# Patient Record
Sex: Male | Born: 1965
Health system: Southern US, Community
[De-identification: ages and names within clinical notes are randomized; demographics above are authoritative.]

## PROBLEM LIST (undated history)

## (undated) DIAGNOSIS — I10 Essential (primary) hypertension: Secondary | ICD-10-CM

## (undated) DIAGNOSIS — J45909 Unspecified asthma, uncomplicated: Secondary | ICD-10-CM

## (undated) DIAGNOSIS — M1990 Unspecified osteoarthritis, unspecified site: Secondary | ICD-10-CM

## (undated) HISTORY — PX: WRIST SURGERY: SHX841

---

## 1997-10-13 ENCOUNTER — Ambulatory Visit (HOSPITAL_COMMUNITY): Admission: RE | Admit: 1997-10-13 | Discharge: 1997-10-13 | Payer: Self-pay | Admitting: Orthopedic Surgery

## 2002-10-03 ENCOUNTER — Encounter: Payer: Self-pay | Admitting: Physical Medicine and Rehabilitation

## 2002-10-03 ENCOUNTER — Ambulatory Visit (HOSPITAL_COMMUNITY)
Admission: RE | Admit: 2002-10-03 | Discharge: 2002-10-03 | Payer: Self-pay | Admitting: Physical Medicine and Rehabilitation

## 2013-12-20 ENCOUNTER — Ambulatory Visit (INDEPENDENT_AMBULATORY_CARE_PROVIDER_SITE_OTHER): Payer: BC Managed Care – PPO | Admitting: Podiatry

## 2013-12-20 ENCOUNTER — Encounter: Payer: Self-pay | Admitting: Podiatry

## 2013-12-20 VITALS — BP 123/83 | HR 71 | Ht 68.0 in | Wt 231.0 lb

## 2013-12-20 DIAGNOSIS — M7741 Metatarsalgia, right foot: Secondary | ICD-10-CM | POA: Insufficient documentation

## 2013-12-20 DIAGNOSIS — M2011 Hallux valgus (acquired), right foot: Secondary | ICD-10-CM

## 2013-12-20 DIAGNOSIS — M21962 Unspecified acquired deformity of left lower leg: Secondary | ICD-10-CM

## 2013-12-20 DIAGNOSIS — M79673 Pain in unspecified foot: Secondary | ICD-10-CM

## 2013-12-20 DIAGNOSIS — M2010 Hallux valgus (acquired), unspecified foot: Secondary | ICD-10-CM | POA: Insufficient documentation

## 2013-12-20 DIAGNOSIS — M722 Plantar fascial fibromatosis: Secondary | ICD-10-CM

## 2013-12-20 NOTE — Patient Instructions (Signed)
Seen for bilateral foot pain. Cortisone injection given to right heel. Metatarsal binder dispensed. Metatarsal pad placed in both feet.  Need custom orthotics. Will prepare next week.

## 2013-12-20 NOTE — Progress Notes (Signed)
Subjective: Patient presents complaining of bilateral foot pain x 10-15 years.  He has had bone spur on right heel and was treated with cortisone and orthotics.  Now the ball of left foot is very painful especially in the morning and at the end of the day.  He is on feet 8 hours a day.   Objective: Dermatologic: Normal skin without any lesions.  Vascular: All pedal pulses are palpable.  Orthopedic: Hallux valgus with bunion right. Hypermobile first ray right. Short first ray left. Pain in right heel and left lesser metatarsophalangeal joint 2-4.  Radiographic examination was done. Findings reveal bone callus over 2nd and 3rd Metatarsal shaft right. Enlarged first metatarsal head on right.  On left foot show, very short first ray in AP view and presence of inferior calcaneal spur right foot in lateral view. And increased lateral deviation of Calcaneocuboid angle bilateral in AP view.  Assessment: 1. Lesser metatarsalgia left foot. 2. Short first ray left foot. 3, HAV with bunion right. 4. Hypermobile first ray right. 5. Plantar fasciitis right.   Plan: Reviewed findings and available options. Right heel injected with mixture of 4 mg Dexamethasone, 4 mg Triamcinolone, and 1 cc of 0.5% Marcaine plain. Patient tolerated well without difficulty.  Metatarsal binder dispensed for both feet. Metatarsal pad placed on both shoe liner. Return for custom orthotics.

## 2013-12-27 ENCOUNTER — Ambulatory Visit: Payer: BC Managed Care – PPO | Admitting: Podiatry

## 2014-01-03 ENCOUNTER — Encounter: Payer: Self-pay | Admitting: Podiatry

## 2014-01-03 ENCOUNTER — Ambulatory Visit (INDEPENDENT_AMBULATORY_CARE_PROVIDER_SITE_OTHER): Payer: BC Managed Care – PPO | Admitting: Podiatry

## 2014-01-03 VITALS — BP 129/83 | HR 71

## 2014-01-03 DIAGNOSIS — M7741 Metatarsalgia, right foot: Secondary | ICD-10-CM

## 2014-01-03 DIAGNOSIS — M722 Plantar fascial fibromatosis: Secondary | ICD-10-CM

## 2014-01-03 DIAGNOSIS — M2010 Hallux valgus (acquired), unspecified foot: Secondary | ICD-10-CM

## 2014-01-03 NOTE — Patient Instructions (Signed)
Both feet casted for orthotics. 

## 2014-01-03 NOTE — Progress Notes (Signed)
Subjective: Right foot has done well after injection on heel. Both feet improved some with Metatarsal binder and pad. Still doing soaking.   Objective: No change in findings.  Dermatologic: Normal skin without any lesions.  Vascular: All pedal pulses are palpable.  Orthopedic: Hallux valgus with bunion right. Hypermobile first ray right. Short first ray left. Pain in right heel and left lesser metatarsophalangeal joint 2-4.   Assessment: 1. Lesser metatarsalgia left foot. 2. Short first ray left foot. 3, HAV with bunion right. 4. Hypermobile first ray right. 5. Plantar fasciitis right.   Plan: Reviewed findings and available options. Both feet casted for orthotics.

## 2014-03-07 ENCOUNTER — Ambulatory Visit (INDEPENDENT_AMBULATORY_CARE_PROVIDER_SITE_OTHER): Payer: BLUE CROSS/BLUE SHIELD | Admitting: Podiatry

## 2014-03-07 ENCOUNTER — Encounter: Payer: Self-pay | Admitting: Podiatry

## 2014-03-07 DIAGNOSIS — M7741 Metatarsalgia, right foot: Secondary | ICD-10-CM

## 2014-03-07 NOTE — Patient Instructions (Signed)
Doing well with orthotics. Use metatarsal binder as needed. Return when pain comes back.

## 2014-03-07 NOTE — Progress Notes (Signed)
Doing much better since wearing orthotics. Uses binder as needed.  Still has some tenderness on left bunion site. Discussed good shoe selection.  Patient will return if pain comes back.

## 2014-08-31 ENCOUNTER — Ambulatory Visit: Payer: Self-pay | Admitting: Surgery

## 2014-08-31 ENCOUNTER — Other Ambulatory Visit: Payer: Self-pay | Admitting: Surgery

## 2014-08-31 DIAGNOSIS — R222 Localized swelling, mass and lump, trunk: Secondary | ICD-10-CM

## 2014-09-04 ENCOUNTER — Other Ambulatory Visit: Payer: Self-pay | Admitting: Surgery

## 2014-09-04 DIAGNOSIS — Z139 Encounter for screening, unspecified: Secondary | ICD-10-CM

## 2014-09-12 ENCOUNTER — Ambulatory Visit
Admission: RE | Admit: 2014-09-12 | Discharge: 2014-09-12 | Disposition: A | Discharge status: Home / Self Care | Payer: BLUE CROSS/BLUE SHIELD | Source: Ambulatory Visit | Attending: Surgery | Admitting: Surgery

## 2014-09-12 ENCOUNTER — Ambulatory Visit
Admission: RE | Admit: 2014-09-12 | Discharge: 2014-09-12 | Disposition: A | Discharge status: Home / Self Care | Payer: Self-pay | Source: Ambulatory Visit | Attending: Surgery | Admitting: Surgery

## 2014-09-12 DIAGNOSIS — R222 Localized swelling, mass and lump, trunk: Secondary | ICD-10-CM

## 2014-09-12 DIAGNOSIS — Z139 Encounter for screening, unspecified: Secondary | ICD-10-CM

## 2014-09-12 MED ORDER — GADOBENATE DIMEGLUMINE 529 MG/ML IV SOLN
20.0000 mL | Freq: Once | INTRAVENOUS | Status: AC | PRN
  Administered 2014-09-12: 20 mL via INTRAVENOUS

## 2014-10-02 ENCOUNTER — Ambulatory Visit: Payer: Self-pay | Admitting: Surgery

## 2014-10-02 NOTE — H&P (Signed)
Gregory Porter 08/31/2014 1:43 PM Location: [facility] Patient #: [id number] DOB: [birth year] Married / Language: Gregory Porter / Race: White Male  History of Present Illness [name] A. [name] MD; 08/31/2014 4:16 PM) Patient words: lipoma lower back   Pt sent at the request of Dr Nelva Bush for two soft tissue masses lower back that are tender. Pt complains of low back pain. He fell 10 years ago. He has noticed two painful knots on lower back for a number of months. Theses are tender especially when he lays on his back. No redness or drainage. Has problems with his feet. No leg numbness or weakness.  The patient is a 49 year old male    Other Problems [name], [place]; 08/31/2014 1:43 PM) Asthma High blood pressure  Past Surgical History [name], [place]; 08/31/2014 1:43 PM) Knee Surgery Right.  Diagnostic Studies History [name], [place]; 08/31/2014 1:43 PM) Colonoscopy within last year  Allergies [name], [place]; 08/31/2014 1:43 PM) No Known Drug Allergies07/08/2014  Medication History [name], [place]; 08/31/2014 1:45 PM) Benicar (40MG  Tablet, Oral) Active. Vitamin B-12 (500MCG Tablet, Oral) Active.  Social History [name], [place]; 08/31/2014 1:43 PM) Alcohol use Moderate alcohol use. Caffeine use Carbonated beverages, Tea. No drug use Tobacco use Never smoker.  Family History [name], [place]; 08/31/2014 1:43 PM) Cancer Mother. Respiratory Condition Father.  Review of Systems ([place]. Brooks CMA; 08/31/2014 1:43 PM) General Not Present- Appetite Loss, Chills, Fatigue, Fever, Night Sweats, Weight Gain and Weight Loss. Skin Not Present- Change in Wart/Mole, Dryness, Hives, Jaundice, New Lesions, Non-Healing Wounds, Rash and Ulcer. HEENT Present- Seasonal Allergies and Wears glasses/contact lenses. Not Present- Earache, Hearing Loss, Hoarseness, Nose Bleed, Oral Ulcers, Ringing in the Ears, Sinus Pain, Sore Throat,  Visual Disturbances and Yellow Eyes. Respiratory Not Present- Bloody sputum, Chronic Cough, Difficulty Breathing, Snoring and Wheezing. Breast Not Present- Breast Mass, Breast Pain, Nipple Discharge and Skin Changes. Cardiovascular Not Present- Chest Pain, Difficulty Breathing Lying Down, Leg Cramps, Palpitations, Rapid Heart Rate, Shortness of Breath and Swelling of Extremities. Gastrointestinal Not Present- Abdominal Pain, Bloating, Bloody Stool, Change in Bowel Habits, Chronic diarrhea, Constipation, Difficulty Swallowing, Excessive gas, Gets full quickly at meals, Hemorrhoids, Indigestion, Nausea, Rectal Pain and Vomiting. Male Genitourinary Not Present- Blood in Urine, Change in Urinary Stream, Frequency, Impotence, Nocturia, Painful Urination, Urgency and Urine Leakage.   Vitals [employer] R. Brooks CMA; 08/31/2014 1:43 PM) 08/31/2014 1:43 PM Weight: 231.38 lb Height: 68.7in Body Surface Area: 2.26 m Body Mass Index: 34.47 kg/m BP: 124/82 (Sitting, Left Arm, Standard)    Physical Exam (Shariyah Eland A. Caliyah Sieh MD; 08/31/2014 4:18 PM) General Mental Status-Alert. General Appearance-Consistent with stated age. Hydration-Well hydrated. Voice-Normal.  Integumentary Note: 4 cm mobile deep soft tissue mass right  2 cm mobile soft tissue mass left  both tender difficult to tell if fixed or not    Chest and Lung Exam Chest and lung exam reveals -quiet, even and easy respiratory effort with no use of accessory muscles and on auscultation, normal breath sounds, no adventitious sounds and normal vocal resonance. Inspection Chest Wall - Normal. Back - normal.  Neurologic Neurologic evaluation reveals -alert and oriented x 3 with no impairment of recent or remote memory. Mental Status-Normal.  Musculoskeletal Normal Exam - Left-Upper Extremity Strength Normal and Lower Extremity Strength Normal. Normal Exam - Right-Upper Extremity Strength Normal and Lower Extremity  Strength Normal.    Assessment & Plan (Westyn Keatley A. Vendetta Pittinger MD; 08/31/2014 4:12 PM)  PALPABLE MASS OF LOWER BACK (782.2  R22.2) Impression: NEEDS MRI TO FURTHER INVESTIGATE SINCE DEEP ON EXAM. If in SQ fat can excise both but not sure this will alleviate all of his back pain. Risk of surgery includes bleeding infection nerve injury blood vessel injury and the need for other surgeries. He agrees to proceed. Current Plans  Pt Education - [employer] Free Text Education/Instructions: discussed with patient and provided information.   Signed by [name], MD (08/31/2014 4:18 PM)

## 2014-10-23 ENCOUNTER — Ambulatory Visit: Payer: Self-pay | Admitting: Surgery

## 2014-10-23 NOTE — H&P (Signed)
Gregory Porter 10/23/2014 9:30 AM Location: [facility] Patient #: [id number] DOB: [birth year] Married / Language: [name] / Race: White Male History of Present Illness Gregory Porter A. Gregory Haggard MD; 10/23/2014 9:51 AM) Patient words: disucss MR results and sx Pt returns to discuss MRI findings aand upcoming surgery. He is about the same otherwise.           CLINICAL DATA: 49 year old male with palpable abnormality in the posterior lumbar soft tissues. Low-back mass, pain. Initial encounter. EXAM: MRI LUMBAR SPINE WITHOUT AND WITH CONTRAST TECHNIQUE: Multiplanar and multiecho pulse sequences of the lumbar spine were obtained without and with intravenous contrast. CONTRAST: 11mL MULTIHANCE GADOBENATE DIMEGLUMINE 529 MG/ML IV SOLN COMPARISON: None. FINDINGS: Markers were placed above and below the palpable abnormality along the posterior lumbar spine. These right paracentral markers are visible on sagittal images 5 and 9, and also on series 6 images 6 and 34. Pre and post-contrast MRI imaging of the lumbar spine to include the entire area of palpable concern was performed. Fat saturated imaging techniques were employed. Increased body habitus with associated increased subcutaneous fat. There is mild subcutaneous fat edema, best seen on series 4, image 13), which is a common finding with large body habitus. No discrete lipoma or subcutaneous mass is identified. The underlying erector spinae and other deep paraspinal muscles appear normal. There is mild enhancement of the interspinous ligament at L4-L5 which does not appear edematous or enlarged, and I favor this is degenerative in nature. There is mild posterior element degeneration also at that level (see below). No retroperitoneal lymphadenopathy. Visualized abdominal viscera are within normal limits. Lumbar segmentation appears to be normal and will be designated as such for this report. Normal lumbar vertebral height and  alignment. No marrow edema or evidence of acute osseous abnormality. Normal bone marrow signal. Visualized lower thoracic spinal cord is normal with conus medularis at L1-L2. Normal cauda equina nerve roots. No abnormal intradural enhancement. The following superimposed degenerative changes are noted: T10-T11: Partially visible, grossly negative. T11-T12: Mild facet hypertrophy, otherwise negative. T12-L1: Negative. L1-L2: Negative. L2-L3: Negative. L3-L4: Negative disc. Borderline to mild facet hypertrophy. No stenosis. L4-L5: Negative disc. Mild to moderate facet hypertrophy. Trace facet joint fluid. No stenosis. L5-S1: Mild facet hypertrophy greater on the left. Mild epidural lipomatosis. No stenosis. Visible sacrum and SI joints appear normal. IMPRESSION: 1. No soft tissue mass or discrete abnormality identified to correspond to the palpable area of concern. Patchy subcutaneous edema is noted throughout the lumbar spine, a common finding in the setting of large body habitus. 2. Mild lower lumbar posterior element degeneration. No disc herniation, spinal stenosis, or neural impingement. Electronically Signed By: Gregory Porter M.D. On: 09/12/2014 17:44   Other Scans Scan on 09/11/2014 1:44 PM by Gregory Porter : [employer] mri estimate <[email or web address] on 09/11/2014 1:44 PM by Gregory Porter : [employer] mri estimate  CLINICAL DATA: 49 year old male with palpable abnormality in the posterior lumbar soft tissues. Low-back mass, pain. Initial encounter. EXAM: MRI LUMBAR SPINE WITHOUT AND WITH CONTRAST TECHNIQUE: Multiplanar and multiecho pulse sequences of the lumbar spine were obtained without and with intravenous contrast. CONTRAST: 38mL MULTIHANCE GADOBENATE DIMEGLUMINE 529 MG/ML IV SOLN COMPARISON: None. FINDINGS: Markers were placed above and below the palpable abnormality along the posterior lumbar spine. These right paracentral markers are visible on  sagittal images 5 and 9, and also on series 6 images 6 and 34. Pre and post-contrast MRI imaging of the lumbar spine to include the entire area of palpable concern was performed.  Fat saturated imaging techniques were employed. Increased body habitus with associated increased subcutaneous fat. There is mild subcutaneous fat edema, best seen on series 4, image 13), which is a common finding with large body habitus. No discrete lipoma or subcutaneous mass is identified. The underlying erector spinae and other deep paraspinal muscles appear normal. There is mild enhancement of the interspinous ligament at L4-L5 which does not appear edematous or enlarged, and I favor this is degenerative in nature. There is mild posterior element degeneration also at that level (see below). No retroperitoneal lymphadenopathy. Visualized abdominal viscera are within normal limits. Lumbar segmentation appears to be normal and will be designated as such for this report. Normal lumbar vertebral height and alignment. No marrow edema or evidence of acute osseous abnormality. Normal bone marrow signal. Visualized lower thoracic spinal cord is normal with conus medularis at L1-L2. Normal cauda equina nerve roots. No abnormal intradural enhancement. The following superimposed degenerative changes are noted: T10-T11: Partially visible, grossly negative. T11-T12: Mild facet hypertrophy, otherwise negative. T12-L1: Negative. L1-L2: Negative. L2-L3: Negative. L3-L4: Negative disc. Borderline to mild facet hypertrophy. No stenosis. L4-L5: Negative disc. Mild to moderate facet hypertrophy. Trace facet joint fluid. No stenosis. L5-S1: Mild facet hypertrophy greater on the left. Mild epidural lipomatosis. No stenosis. Visible sacrum and SI joints appear normal. IMPRESSION: 1. No soft tissue mass or discrete abnormality identified to correspond to the palpable area of concern. Patchy subcutaneous edema is noted  throughout the lumbar spine, a common finding in the setting of large body habitus. 2. Mild lower lumbar posterior element degeneration. No disc herniation, spinal stenosis, or neural impingement. Electronically Signed By: Gregory Porter M.D. On: 09/12/2014 17:44 .  The patient is a 49 year old male   Problem List/Past Medical [name], [place]; 10/23/2014 9:32 AM) PALPABLE MASS OF LOWER BACK (782.2  R22.2)  Other Problems [name], CMA; 10/23/2014 9:32 AM) Asthma High blood pressure  Past Surgical History [name], CMA; 10/23/2014 9:32 AM) Knee Surgery Right.  Diagnostic Studies History [name], [place]; 10/23/2014 9:32 AM) Colonoscopy within last year  Allergies [name], [place]; 10/23/2014 9:32 AM) No Known Drug Allergies 08/31/2014  Medication History [name], [place]; 10/23/2014 9:32 AM) Benicar (40MG  Tablet, Oral) Active. Vitamin B-12 (500MCG Tablet, Oral) Active.  Social History [name], [place]; 10/23/2014 9:32 AM) Alcohol use Moderate alcohol use. Caffeine use Carbonated beverages, Tea. No drug use Tobacco use Never smoker.  Family History [name], [place]; 10/23/2014 9:32 AM) Cancer Mother. Respiratory Condition Father.    Vitals [employer] R. Brooks CMA; 10/23/2014 9:31 AM) 10/23/2014 9:30 AM Weight: 236.13 lb Height: 68.7in Body Surface Area: 2.28 m Body Mass Index: 35.17 kg/m BP: 132/86 (Sitting, Left Arm, Standard)     Physical Exam (Brook Geraci A. Rosaleen Mazer MD; 10/23/2014 9:52 AM)  General Mental Status-Alert. General Appearance-Consistent with stated age. Hydration-Well hydrated. Voice-Normal.  Integumentary Note: 4 cm lipoma right of miline lower backand iliac crest 3 cm lipoma left lower back mobile    Neurologic Neurologic evaluation reveals -alert and oriented x 3 with no impairment of recent or remote memory. Mental  Status-Normal.  Musculoskeletal Normal Exam - Left-Upper Extremity Strength Normal and Lower Extremity Strength Normal. Normal Exam - Right-Upper Extremity Strength Normal, Lower Extremity Weakness.    Assessment & Plan (Waynetta Metheny A. Olando Willems MD; 10/23/2014 9:50 AM)  LIPOMA OF BACK (214.8  D17.1) Impression: SPENT 20 MINUTES DISCUSSING MRI AND UPCOMING SURGERY  Current Plans Pt Education - [employer]  Free Text Education/Instructions: discussed with patient and provided information.

## 2014-10-26 ENCOUNTER — Encounter (HOSPITAL_BASED_OUTPATIENT_CLINIC_OR_DEPARTMENT_OTHER): Payer: Self-pay | Admitting: *Deleted

## 2014-10-27 ENCOUNTER — Other Ambulatory Visit: Payer: Self-pay

## 2014-10-27 ENCOUNTER — Encounter (HOSPITAL_BASED_OUTPATIENT_CLINIC_OR_DEPARTMENT_OTHER)
Admission: RE | Admit: 2014-10-27 | Discharge: 2014-10-27 | Disposition: A | Discharge status: Home / Self Care | Payer: BLUE CROSS/BLUE SHIELD | Source: Ambulatory Visit | Attending: Surgery | Admitting: Surgery

## 2014-10-27 DIAGNOSIS — J45909 Unspecified asthma, uncomplicated: Secondary | ICD-10-CM | POA: Diagnosis not present

## 2014-10-27 DIAGNOSIS — I10 Essential (primary) hypertension: Secondary | ICD-10-CM | POA: Diagnosis not present

## 2014-10-27 DIAGNOSIS — Z6835 Body mass index (BMI) 35.0-35.9, adult: Secondary | ICD-10-CM | POA: Diagnosis not present

## 2014-10-27 DIAGNOSIS — D171 Benign lipomatous neoplasm of skin and subcutaneous tissue of trunk: Secondary | ICD-10-CM | POA: Diagnosis not present

## 2014-10-27 LAB — COMPREHENSIVE METABOLIC PANEL
ALBUMIN: 3.8 g/dL (ref 3.5–5.0)
ALT: 24 U/L (ref 17–63)
ANION GAP: 8 (ref 5–15)
AST: 15 U/L (ref 15–41)
Alkaline Phosphatase: 73 U/L (ref 38–126)
BUN: 14 mg/dL (ref 6–20)
CHLORIDE: 108 mmol/L (ref 101–111)
CO2: 24 mmol/L (ref 22–32)
Calcium: 9.5 mg/dL (ref 8.9–10.3)
Creatinine, Ser: 1.2 mg/dL (ref 0.61–1.24)
GFR calc Af Amer: >60 mL/min (ref >60)
GFR calc non Af Amer: >60 mL/min (ref >60)
GLUCOSE: 102 mg/dL — AB (ref 65–99)
POTASSIUM: 4.4 mmol/L (ref 3.5–5.1)
SODIUM: 140 mmol/L (ref 135–145)
TOTAL PROTEIN: 7.2 g/dL (ref 6.5–8.1)
Total Bilirubin: 0.6 mg/dL (ref 0.3–1.2)

## 2014-10-27 LAB — CBC WITH DIFFERENTIAL/PLATELET
BASOS ABS: 0 10*3/uL (ref 0.0–0.1)
BASOS PCT: 0 % (ref 0–1)
EOS ABS: 0.1 10*3/uL (ref 0.0–0.7)
Eosinophils Relative: 1 % (ref 0–5)
HEMATOCRIT: 32.1 % — AB (ref 39.0–52.0)
Hemoglobin: 10.5 g/dL — ABNORMAL LOW (ref 13.0–17.0)
Lymphocytes Relative: 20 % (ref 12–46)
Lymphs Abs: 1.2 10*3/uL (ref 0.7–4.0)
MCH: 30.3 pg (ref 26.0–34.0)
MCHC: 32.7 g/dL (ref 30.0–36.0)
MCV: 92.5 fL (ref 78.0–100.0)
MONO ABS: 0.6 10*3/uL (ref 0.1–1.0)
MONOS PCT: 11 % (ref 3–12)
NEUTROS ABS: 4 10*3/uL (ref 1.7–7.7)
Neutrophils Relative %: 68 % (ref 43–77)
PLATELETS: 222 10*3/uL (ref 150–400)
RBC: 3.47 MIL/uL — ABNORMAL LOW (ref 4.22–5.81)
RDW: 12.5 % (ref 11.5–15.5)
WBC: 5.9 10*3/uL (ref 4.0–10.5)

## 2014-11-01 DIAGNOSIS — M722 Plantar fascial fibromatosis: Secondary | ICD-10-CM

## 2014-11-02 ENCOUNTER — Ambulatory Visit (HOSPITAL_BASED_OUTPATIENT_CLINIC_OR_DEPARTMENT_OTHER): Payer: BLUE CROSS/BLUE SHIELD | Admitting: Anesthesiology

## 2014-11-02 ENCOUNTER — Ambulatory Visit (HOSPITAL_BASED_OUTPATIENT_CLINIC_OR_DEPARTMENT_OTHER)
Admission: RE | Admit: 2014-11-02 | Discharge: 2014-11-02 | Disposition: A | Discharge status: Home / Self Care | Payer: BLUE CROSS/BLUE SHIELD | Source: Ambulatory Visit | Attending: Surgery | Admitting: Surgery

## 2014-11-02 ENCOUNTER — Encounter (HOSPITAL_BASED_OUTPATIENT_CLINIC_OR_DEPARTMENT_OTHER)
Admission: RE | Disposition: A | Discharge status: Home / Self Care | Payer: Self-pay | Source: Ambulatory Visit | Attending: Surgery

## 2014-11-02 ENCOUNTER — Encounter (HOSPITAL_BASED_OUTPATIENT_CLINIC_OR_DEPARTMENT_OTHER): Payer: Self-pay | Admitting: *Deleted

## 2014-11-02 DIAGNOSIS — J45909 Unspecified asthma, uncomplicated: Secondary | ICD-10-CM | POA: Insufficient documentation

## 2014-11-02 DIAGNOSIS — D171 Benign lipomatous neoplasm of skin and subcutaneous tissue of trunk: Secondary | ICD-10-CM | POA: Insufficient documentation

## 2014-11-02 DIAGNOSIS — Z6835 Body mass index (BMI) 35.0-35.9, adult: Secondary | ICD-10-CM | POA: Insufficient documentation

## 2014-11-02 DIAGNOSIS — I10 Essential (primary) hypertension: Secondary | ICD-10-CM | POA: Insufficient documentation

## 2014-11-02 HISTORY — PX: MASS EXCISION: SHX2000

## 2014-11-02 HISTORY — DX: Unspecified asthma, uncomplicated: J45.909

## 2014-11-02 HISTORY — DX: Essential (primary) hypertension: I10

## 2014-11-02 SURGERY — EXCISION MASS
Anesthesia: General | Site: Back

## 2014-11-02 MED ORDER — DEXAMETHASONE SODIUM PHOSPHATE 4 MG/ML IJ SOLN
INTRAMUSCULAR | Status: DC | PRN
  Administered 2014-11-02: 10 mg via INTRAVENOUS

## 2014-11-02 MED ORDER — HYDROMORPHONE HCL 1 MG/ML IJ SOLN
0.2500 mg | INTRAMUSCULAR | Status: DC | PRN
  Administered 2014-11-02 (×2): 0.5 mg via INTRAVENOUS

## 2014-11-02 MED ORDER — ACETAMINOPHEN 325 MG PO TABS: 325.0000 mg | ORAL_TABLET | ORAL | Status: DC | PRN

## 2014-11-02 MED ORDER — PROPOFOL 10 MG/ML IV BOLUS
INTRAVENOUS | Status: AC
  Filled 2014-11-02: qty 20

## 2014-11-02 MED ORDER — FENTANYL CITRATE (PF) 100 MCG/2ML IJ SOLN
INTRAMUSCULAR | Status: DC | PRN
  Administered 2014-11-02: 100 ug via INTRAVENOUS

## 2014-11-02 MED ORDER — LIDOCAINE HCL (CARDIAC) 10 MG/ML IV SOLN
INTRAVENOUS | Status: DC | PRN
  Administered 2014-11-02: 100 mg via INTRAVENOUS

## 2014-11-02 MED ORDER — CEFAZOLIN SODIUM 1-5 GM-% IV SOLN
INTRAVENOUS | Status: AC
  Filled 2014-11-02: qty 50

## 2014-11-02 MED ORDER — LACTATED RINGERS IV SOLN
INTRAVENOUS | Status: DC
  Administered 2014-11-02: 13:00:00 via INTRAVENOUS

## 2014-11-02 MED ORDER — ACETAMINOPHEN 160 MG/5ML PO SOLN: 325.0000 mg | ORAL | Status: DC | PRN

## 2014-11-02 MED ORDER — SUCCINYLCHOLINE CHLORIDE 20 MG/ML IJ SOLN
INTRAMUSCULAR | Status: DC | PRN
  Administered 2014-11-02: 100 mg via INTRAVENOUS

## 2014-11-02 MED ORDER — PROPOFOL 10 MG/ML IV BOLUS
INTRAVENOUS | Status: DC | PRN
  Administered 2014-11-02: 200 mg via INTRAVENOUS

## 2014-11-02 MED ORDER — ONDANSETRON HCL 4 MG/2ML IJ SOLN
INTRAMUSCULAR | Status: DC | PRN
  Administered 2014-11-02: 4 mg via INTRAVENOUS

## 2014-11-02 MED ORDER — 0.9 % SODIUM CHLORIDE (POUR BTL) OPTIME
TOPICAL | Status: DC | PRN
  Administered 2014-11-02: 200 mL

## 2014-11-02 MED ORDER — GLYCOPYRROLATE 0.2 MG/ML IJ SOLN: 0.2000 mg | Freq: Once | INTRAMUSCULAR | Status: DC | PRN

## 2014-11-02 MED ORDER — SCOPOLAMINE 1 MG/3DAYS TD PT72: 1.0000 | MEDICATED_PATCH | Freq: Once | TRANSDERMAL | Status: DC | PRN

## 2014-11-02 MED ORDER — LIDOCAINE HCL (CARDIAC) 20 MG/ML IV SOLN
INTRAVENOUS | Status: AC
  Filled 2014-11-02: qty 5

## 2014-11-02 MED ORDER — MIDAZOLAM HCL 5 MG/5ML IJ SOLN
INTRAMUSCULAR | Status: DC | PRN
  Administered 2014-11-02: 2 mg via INTRAVENOUS

## 2014-11-02 MED ORDER — DEXTROSE 5 % IV SOLN
3.0000 g | INTRAVENOUS | Status: AC
  Administered 2014-11-02: 3 g via INTRAVENOUS

## 2014-11-02 MED ORDER — FENTANYL CITRATE (PF) 100 MCG/2ML IJ SOLN
INTRAMUSCULAR | Status: AC
  Filled 2014-11-02: qty 4

## 2014-11-02 MED ORDER — OXYCODONE HCL 5 MG/5ML PO SOLN: 5.0000 mg | Freq: Once | ORAL | Status: DC | PRN

## 2014-11-02 MED ORDER — ONDANSETRON HCL 4 MG/2ML IJ SOLN
INTRAMUSCULAR | Status: AC
  Filled 2014-11-02: qty 2

## 2014-11-02 MED ORDER — BUPIVACAINE-EPINEPHRINE 0.25% -1:200000 IJ SOLN
INTRAMUSCULAR | Status: DC | PRN
  Administered 2014-11-02: 20 mL

## 2014-11-02 MED ORDER — CEFAZOLIN SODIUM-DEXTROSE 2-3 GM-% IV SOLR
INTRAVENOUS | Status: AC
  Filled 2014-11-02: qty 50

## 2014-11-02 MED ORDER — OXYCODONE HCL 5 MG PO TABS: 5.0000 mg | ORAL_TABLET | Freq: Once | ORAL | Status: DC | PRN

## 2014-11-02 MED ORDER — MIDAZOLAM HCL 2 MG/2ML IJ SOLN
INTRAMUSCULAR | Status: AC
  Filled 2014-11-02: qty 4

## 2014-11-02 MED ORDER — GLYCOPYRROLATE 0.2 MG/ML IJ SOLN
INTRAMUSCULAR | Status: AC
  Filled 2014-11-02: qty 1

## 2014-11-02 MED ORDER — CHLORHEXIDINE GLUCONATE 4 % EX LIQD: 1.0000 "application " | Freq: Once | CUTANEOUS | Status: DC

## 2014-11-02 MED ORDER — OXYCODONE-ACETAMINOPHEN 5-325 MG PO TABS: 1.0000 | ORAL_TABLET | ORAL | Status: DC | PRN

## 2014-11-02 MED ORDER — HYDROMORPHONE HCL 1 MG/ML IJ SOLN
INTRAMUSCULAR | Status: AC
  Filled 2014-11-02: qty 1

## 2014-11-02 MED ORDER — DEXAMETHASONE SODIUM PHOSPHATE 10 MG/ML IJ SOLN
INTRAMUSCULAR | Status: AC
  Filled 2014-11-02: qty 1

## 2014-11-02 SURGICAL SUPPLY — 38 items
BENZOIN TINCTURE PRP APPL 2/3 (GAUZE/BANDAGES/DRESSINGS) IMPLANT
BLADE SURG 10 STRL SS (BLADE) IMPLANT
BLADE SURG 15 STRL LF DISP TIS (BLADE) ×1 IMPLANT
BLADE SURG 15 STRL SS (BLADE) ×1
CANISTER SUCT 1200ML W/VALVE (MISCELLANEOUS) IMPLANT
CHLORAPREP W/TINT 26ML (MISCELLANEOUS) ×2 IMPLANT
COVER BACK TABLE 60X90IN (DRAPES) ×2 IMPLANT
COVER MAYO STAND STRL (DRAPES) ×2 IMPLANT
DECANTER SPIKE VIAL GLASS SM (MISCELLANEOUS) IMPLANT
DRAPE LAPAROTOMY 100X72 PEDS (DRAPES) ×2 IMPLANT
DRAPE UTILITY XL STRL (DRAPES) ×2 IMPLANT
ELECT COATED BLADE 2.86 ST (ELECTRODE) ×2 IMPLANT
ELECT REM PT RETURN 9FT ADLT (ELECTROSURGICAL) ×2
ELECTRODE REM PT RTRN 9FT ADLT (ELECTROSURGICAL) ×1 IMPLANT
GLOVE BIOGEL PI IND STRL 8 (GLOVE) ×1 IMPLANT
GLOVE BIOGEL PI INDICATOR 8 (GLOVE) ×1
GLOVE ECLIPSE 8.0 STRL XLNG CF (GLOVE) ×2 IMPLANT
GLOVE EXAM NITRILE LRG STRL (GLOVE) ×2 IMPLANT
GLOVE SURG SS PI 7.0 STRL IVOR (GLOVE) ×2 IMPLANT
GOWN STRL REUS W/ TWL LRG LVL3 (GOWN DISPOSABLE) ×2 IMPLANT
GOWN STRL REUS W/TWL LRG LVL3 (GOWN DISPOSABLE) ×2
LIQUID BAND (GAUZE/BANDAGES/DRESSINGS) ×2 IMPLANT
NEEDLE HYPO 25X1 1.5 SAFETY (NEEDLE) ×2 IMPLANT
NS IRRIG 1000ML POUR BTL (IV SOLUTION) ×2 IMPLANT
PACK BASIN DAY SURGERY FS (CUSTOM PROCEDURE TRAY) ×2 IMPLANT
PENCIL BUTTON HOLSTER BLD 10FT (ELECTRODE) ×2 IMPLANT
SLEEVE SCD COMPRESS KNEE MED (MISCELLANEOUS) ×2 IMPLANT
SPONGE LAP 4X18 X RAY DECT (DISPOSABLE) IMPLANT
STAPLER VISISTAT 35W (STAPLE) IMPLANT
STRIP CLOSURE SKIN 1/2X4 (GAUZE/BANDAGES/DRESSINGS) IMPLANT
SUT MON AB 4-0 PC3 18 (SUTURE) ×2 IMPLANT
SUT VICRYL 3-0 CR8 SH (SUTURE) ×2 IMPLANT
SUT VICRYL AB 3 0 TIES (SUTURE) IMPLANT
SYR CONTROL 10ML LL (SYRINGE) ×2 IMPLANT
TOWEL OR 17X24 6PK STRL BLUE (TOWEL DISPOSABLE) ×2 IMPLANT
TOWEL OR NON WOVEN STRL DISP B (DISPOSABLE) ×2 IMPLANT
TUBE CONNECTING 20X1/4 (TUBING) IMPLANT
YANKAUER SUCT BULB TIP NO VENT (SUCTIONS) IMPLANT

## 2014-11-02 NOTE — Anesthesia Procedure Notes (Signed)
Procedure Name: Intubation Date/Time: 11/02/2014 2:03 PM Performed by: Lyndee Leo Pre-anesthesia Checklist: Patient identified, Emergency Drugs available, Suction available and Patient being monitored Patient Re-evaluated:Patient Re-evaluated prior to inductionOxygen Delivery Method: Circle System Utilized Preoxygenation: Pre-oxygenation with 100% oxygen Intubation Type: IV induction Ventilation: Mask ventilation without difficulty Laryngoscope Size: Glidescope Grade View: Grade III Tube type: Oral Number of attempts: 1 Airway Equipment and Method: Stylet and Oral airway Placement Confirmation: ETT inserted through vocal cords under direct vision,  positive ETCO2 and breath sounds checked- equal and bilateral Secured at: 22 cm Tube secured with: Tape Dental Injury: Teeth and Oropharynx as per pre-operative assessment

## 2014-11-02 NOTE — Brief Op Note (Signed)
11/02/2014  2:57 PM  PATIENT:  Gregory Porter  49 y.o. male  PRE-OPERATIVE DIAGNOSIS:  Back Mass  POST-OPERATIVE DIAGNOSIS:  Back Mass  PROCEDURE:  Procedure(s): EXCISION LOWER BACK MASSES (N/A)  SURGEON:  Surgeon(s) and Role:    * Erroll Luna, MD - Primary  PHYSICIAN ASSISTANT:   ASSISTANTS: none   ANESTHESIA:   local and general  EBL:    minimal  BLOOD ADMINISTERED:none  DRAINS: none   LOCAL MEDICATIONS USED:  BUPIVICAINE   SPECIMEN:  Source of Specimen:  lower back subcutaneous fat  DISPOSITION OF SPECIMEN:  PATHOLOGY  COUNTS:  YES  TOURNIQUET:  * No tourniquets in log *  DICTATION: .Other Dictation: Dictation Number [id number]  PLAN OF CARE: Discharge to home after PACU  PATIENT DISPOSITION:  PACU - hemodynamically stable.   Delay start of Pharmacological VTE agent (>24hrs) due to surgical blood loss or risk of bleeding: yes

## 2014-11-02 NOTE — Discharge Instructions (Signed)
GENERAL SURGERY: POST OP INSTRUCTIONS ° °1. DIET: Follow a light bland diet the first 24 hours after arrival home, such as soup, liquids, crackers, etc.  Be sure to include lots of fluids daily.  Avoid fast food or heavy meals as your are more likely to get nauseated.   °2. Take your usually prescribed home medications unless otherwise directed. °3. PAIN CONTROL: °a. Pain is best controlled by a usual combination of three different methods TOGETHER: °i. Ice/Heat °ii. Over the counter pain medication °iii. Prescription pain medication °b. Most patients will experience some swelling and bruising around the incisions.  Ice packs or heating pads (30-60 minutes up to 6 times a day) will help. Use ice for the first few days to help decrease swelling and bruising, then switch to heat to help relax tight/sore spots and speed recovery.  Some people prefer to use ice alone, heat alone, alternating between ice & heat.  Experiment to what works for you.  Swelling and bruising can take several weeks to resolve.   °c. It is helpful to take an over-the-counter pain medication regularly for the first few weeks.  Choose one of the following that works best for you: °i. Naproxen (Aleve, etc)  Two 220mg tabs twice a day °ii. Ibuprofen (Advil, etc) Three 200mg tabs four times a day (every meal & bedtime) °iii. Acetaminophen (Tylenol, etc) 500-650mg four times a day (every meal & bedtime) °d. A  prescription for pain medication (such as oxycodone, hydrocodone, etc) should be given to you upon discharge.  Take your pain medication as prescribed.  °i. If you are having problems/concerns with the prescription medicine (does not control pain, nausea, vomiting, rash, itching, etc), please call us [phone] to see if we need to switch you to a different pain medicine that will work better for you and/or control your side effect better. °ii. If you need a refill on your pain medication, please contact your pharmacy.  They will contact our  office to request authorization. Prescriptions will not be filled after 5 pm or on week-ends. °4. Avoid getting constipated.  Between the surgery and the pain medications, it is common to experience some constipation.  Increasing fluid intake and taking a fiber supplement (such as Metamucil, Citrucel, FiberCon, MiraLax, etc) 1-2 times a day regularly will usually help prevent this problem from occurring.  A mild laxative (prune juice, Milk of Magnesia, MiraLax, etc) should be taken according to package directions if there are no bowel movements after 48 hours.   °5. Wash / shower every day.  You may shower over the dressings as they are waterproof.  Continue to shower over incision(s) after the dressing is off. °6. Remove your waterproof bandages 5 days after surgery.  You may leave the incision open to air.  You may have skin tapes (Steri Strips) covering the incision(s).  Leave them on until one week, then remove.  You may replace a dressing/Band-Aid to cover the incision for comfort if you wish.  ° ° ° ° °7. ACTIVITIES as tolerated:   °a. You may resume regular (light) daily activities beginning the next day--such as daily self-care, walking, climbing stairs--gradually increasing activities as tolerated.  If you can walk 30 minutes without difficulty, it is safe to try more intense activity such as jogging, treadmill, bicycling, low-impact aerobics, swimming, etc. °b. Save the most intensive and strenuous activity for last such as sit-ups, heavy lifting, contact sports, etc  Refrain from any heavy lifting or straining until you   are off narcotics for pain control.   °c. DO NOT PUSH THROUGH PAIN.  Let pain be your guide: If it hurts to do something, don't do it.  Pain is your body warning you to avoid that activity for another week until the pain goes down. °d. You may drive when you are no longer taking prescription pain medication, you can comfortably wear a seatbelt, and you can safely maneuver your car and  apply brakes. °e. You may have sexual intercourse when it is comfortable.  °8. FOLLOW UP in our office °a. Please call [facility] at [phone] to set up an appointment to see your surgeon in the office for a follow-up appointment approximately 2-3 weeks after your surgery. °b. Make sure that you call for this appointment the day you arrive home to insure a convenient appointment time. °9. IF YOU HAVE DISABILITY OR FAMILY LEAVE FORMS, BRING THEM TO THE OFFICE FOR PROCESSING.  DO NOT GIVE THEM TO YOUR DOCTOR. ° ° °WHEN TO CALL US [phone]: °1. Poor pain control °2. Reactions / problems with new medications (rash/itching, nausea, etc)  °3. Fever over 101.5 F (38.5 C) °4. Worsening swelling or bruising °5. Continued bleeding from incision. °6. Increased pain, redness, or drainage from the incision °7. Difficulty breathing / swallowing ° ° The clinic staff is available to answer your questions during regular business hours (8:30am-5pm).  Please don’t hesitate to call and ask to speak to one of our nurses for clinical concerns.  ° If you have a medical emergency, go to the nearest emergency room or call 911. ° A surgeon from [facility] is always on call at the hospitals ° ° °[facility] °[address], [address], [place], [place]  [address] ? °MAIN: [phone] ? TOLL FREE: 1-800-359-8415 ?  °FAX [phone] °[email or web address] ° °Post Anesthesia Home Care Instructions ° °Activity: °Get plenty of rest for the remainder of the day. A responsible adult should stay with you for 24 hours following the procedure.  °For the next 24 hours, DO NOT: °-Drive a car °-Operate machinery °-Drink alcoholic beverages °-Take any medication unless instructed by your physician °-Make any legal decisions or sign important papers. ° °Meals: °Start with liquid foods such as gelatin or soup. Progress to regular foods as tolerated. Avoid greasy, spicy, heavy foods. If nausea and/or  vomiting occur, drink only clear liquids until the nausea and/or vomiting subsides. Call your physician if vomiting continues. ° °Special Instructions/Symptoms: °Your throat may feel dry or sore from the anesthesia or the breathing tube placed in your throat during surgery. If this causes discomfort, gargle with warm salt water. The discomfort should disappear within 24 hours. ° °If you had a scopolamine patch placed behind your ear for the management of post- operative nausea and/or vomiting: ° °1. The medication in the patch is effective for 72 hours, after which it should be removed.  Wrap patch in a tissue and discard in the trash. Wash hands thoroughly with soap and water. °2. You may remove the patch earlier than 72 hours if you experience unpleasant side effects which may include dry mouth, dizziness or visual disturbances. °3. Avoid touching the patch. Wash your hands with soap and water after contact with the patch. °  ° °

## 2014-11-02 NOTE — Anesthesia Preprocedure Evaluation (Signed)
Anesthesia Evaluation  Patient identified by MRN, date of birth, ID band Patient awake    Reviewed: Allergy & Precautions, NPO status , Patient's Chart, lab work & pertinent test results  History of Anesthesia Complications Negative for: history of anesthetic complications  Airway Mallampati: II  TM Distance: >3 FB Neck ROM: Full    Dental  (+) Teeth Intact   Pulmonary asthma ,    breath sounds clear to auscultation       Cardiovascular hypertension, Pt. on medications (-) angina(-) Past MI and (-) CHF  Rhythm:Regular     Neuro/Psych negative neurological ROS  negative psych ROS   GI/Hepatic negative GI ROS, Neg liver ROS,   Endo/Other  Morbid obesity  Renal/GU negative Renal ROS     Musculoskeletal   Abdominal   Peds  Hematology negative hematology ROS (+)   Anesthesia Other Findings   Reproductive/Obstetrics                             Anesthesia Physical Anesthesia Plan  ASA: II  Anesthesia Plan: General   Post-op Pain Management:    Induction: Intravenous  Airway Management Planned: Oral ETT  Additional Equipment: None  Intra-op Plan:   Post-operative Plan: Extubation in OR  Informed Consent: I have reviewed the patients History and Physical, chart, labs and discussed the procedure including the risks, benefits and alternatives for the proposed anesthesia with the patient or authorized representative who has indicated his/her understanding and acceptance.   Dental advisory given  Plan Discussed with: CRNA and Surgeon  Anesthesia Plan Comments:         Anesthesia Quick Evaluation

## 2014-11-02 NOTE — Anesthesia Postprocedure Evaluation (Signed)
  Anesthesia Post-op Note  Patient: Gregory Porter  Procedure(s) Performed: Procedure(s): EXCISION LOWER BACK MASSES (N/A)  Patient Location: PACU  Anesthesia Type:General  Level of Consciousness: awake  Airway and Oxygen Therapy: Patient Spontanous Breathing  Post-op Pain: mild  Post-op Assessment: Post-op Vital signs reviewed, Patient's Cardiovascular Status Stable, Respiratory Function Stable, Patent Airway, No signs of Nausea or vomiting and Pain level controlled              Post-op Vital Signs: Reviewed and stable  Last Vitals:  Filed Vitals:   11/02/14 1545  BP: 141/95  Pulse: 72  Temp:   Resp: 18    Complications: No apparent anesthesia complications

## 2014-11-02 NOTE — Interval H&P Note (Signed)
History and Physical Interval Note:  11/02/2014 1:43 PM  Gregory Porter  has presented today for surgery, with the diagnosis of Back Mass  The various methods of treatment have been discussed with the patient and family. After consideration of risks, benefits and other options for treatment, the patient has consented to  Procedure(s): EXCISION LOWER BACK MASSES (N/A) as a surgical intervention .  The patient's history has been reviewed, patient examined, no change in status, stable for surgery.  I have reviewed the patient's chart and labs.  Questions were answered to the patient's satisfaction.     Sharlette Jansma A.

## 2014-11-02 NOTE — H&P (View-Only) (Signed)
Gregory Porter 10/23/2014 9:30 AM Location: [facility] Patient #: [id number] DOB: 11/27/1965 Married / Language: English / Race: White Male History of Present Illness (Cirilo Canner A. Cuyler Vandyken MD; 10/23/2014 9:51 AM) Patient words: disucss MR results and sx Pt returns to discuss MRI findings aand upcoming surgery. He is about the same otherwise.           CLINICAL DATA: 49-year-old male with palpable abnormality in the posterior lumbar soft tissues. Low-back mass, pain. Initial encounter. EXAM: MRI LUMBAR SPINE WITHOUT AND WITH CONTRAST TECHNIQUE: Multiplanar and multiecho pulse sequences of the lumbar spine were obtained without and with intravenous contrast. CONTRAST: 20mL MULTIHANCE GADOBENATE DIMEGLUMINE 529 MG/ML IV SOLN COMPARISON: None. FINDINGS: Markers were placed above and below the palpable abnormality along the posterior lumbar spine. These right paracentral markers are visible on sagittal images 5 and 9, and also on series 6 images 6 and 34. Pre and post-contrast MRI imaging of the lumbar spine to include the entire area of palpable concern was performed. Fat saturated imaging techniques were employed. Increased body habitus with associated increased subcutaneous fat. There is mild subcutaneous fat edema, best seen on series 4, image 13), which is a common finding with large body habitus. No discrete lipoma or subcutaneous mass is identified. The underlying erector spinae and other deep paraspinal muscles appear normal. There is mild enhancement of the interspinous ligament at L4-L5 which does not appear edematous or enlarged, and I favor this is degenerative in nature. There is mild posterior element degeneration also at that level (see below). No retroperitoneal lymphadenopathy. Visualized abdominal viscera are within normal limits. Lumbar segmentation appears to be normal and will be designated as such for this report. Normal lumbar vertebral height and  alignment. No marrow edema or evidence of acute osseous abnormality. Normal bone marrow signal. Visualized lower thoracic spinal cord is normal with conus medularis at L1-L2. Normal cauda equina nerve roots. No abnormal intradural enhancement. The following superimposed degenerative changes are noted: T10-T11: Partially visible, grossly negative. T11-T12: Mild facet hypertrophy, otherwise negative. T12-L1: Negative. L1-L2: Negative. L2-L3: Negative. L3-L4: Negative disc. Borderline to mild facet hypertrophy. No stenosis. L4-L5: Negative disc. Mild to moderate facet hypertrophy. Trace facet joint fluid. No stenosis. L5-S1: Mild facet hypertrophy greater on the left. Mild epidural lipomatosis. No stenosis. Visible sacrum and SI joints appear normal. IMPRESSION: 1. No soft tissue mass or discrete abnormality identified to correspond to the palpable area of concern. Patchy subcutaneous edema is noted throughout the lumbar spine, a common finding in the setting of large body habitus. 2. Mild lower lumbar posterior element degeneration. No disc herniation, spinal stenosis, or neural impingement. Electronically Signed By: H Hall M.D. On: 09/12/2014 17:44   Other Scans Scan on 09/11/2014 1:44 PM by Jennifer Webster : [employer] mri estimate <[email or web address] on 09/11/2014 1:44 PM by Jennifer Webster : [employer] mri estimate  CLINICAL DATA: 49-year-old male with palpable abnormality in the posterior lumbar soft tissues. Low-back mass, pain. Initial encounter. EXAM: MRI LUMBAR SPINE WITHOUT AND WITH CONTRAST TECHNIQUE: Multiplanar and multiecho pulse sequences of the lumbar spine were obtained without and with intravenous contrast. CONTRAST: 20mL MULTIHANCE GADOBENATE DIMEGLUMINE 529 MG/ML IV SOLN COMPARISON: None. FINDINGS: Markers were placed above and below the palpable abnormality along the posterior lumbar spine. These right paracentral markers are visible on  sagittal images 5 and 9, and also on series 6 images 6 and 34. Pre and post-contrast MRI imaging of the lumbar spine to include the entire area of palpable concern was performed.   Fat saturated imaging techniques were employed. Increased body habitus with associated increased subcutaneous fat. There is mild subcutaneous fat edema, best seen on series 4, image 13), which is a common finding with large body habitus. No discrete lipoma or subcutaneous mass is identified. The underlying erector spinae and other deep paraspinal muscles appear normal. There is mild enhancement of the interspinous ligament at L4-L5 which does not appear edematous or enlarged, and I favor this is degenerative in nature. There is mild posterior element degeneration also at that level (see below). No retroperitoneal lymphadenopathy. Visualized abdominal viscera are within normal limits. Lumbar segmentation appears to be normal and will be designated as such for this report. Normal lumbar vertebral height and alignment. No marrow edema or evidence of acute osseous abnormality. Normal bone marrow signal. Visualized lower thoracic spinal cord is normal with conus medularis at L1-L2. Normal cauda equina nerve roots. No abnormal intradural enhancement. The following superimposed degenerative changes are noted: T10-T11: Partially visible, grossly negative. T11-T12: Mild facet hypertrophy, otherwise negative. T12-L1: Negative. L1-L2: Negative. L2-L3: Negative. L3-L4: Negative disc. Borderline to mild facet hypertrophy. No stenosis. L4-L5: Negative disc. Mild to moderate facet hypertrophy. Trace facet joint fluid. No stenosis. L5-S1: Mild facet hypertrophy greater on the left. Mild epidural lipomatosis. No stenosis. Visible sacrum and SI joints appear normal. IMPRESSION: 1. No soft tissue mass or discrete abnormality identified to correspond to the palpable area of concern. Patchy subcutaneous edema is noted  throughout the lumbar spine, a common finding in the setting of large body habitus. 2. Mild lower lumbar posterior element degeneration. No disc herniation, spinal stenosis, or neural impingement. Electronically Signed By: H Hall M.D. On: 09/12/2014 17:44 .  The patient is a 49 year old male   Problem List/Past Medical (Michelle R Brooks, CMA; 10/23/2014 9:32 AM) PALPABLE MASS OF LOWER BACK (782.2  R22.2)  Other Problems (Michelle R Brooks, CMA; 10/23/2014 9:32 AM) Asthma High blood pressure  Past Surgical History (Michelle R Brooks, CMA; 10/23/2014 9:32 AM) Knee Surgery Right.  Diagnostic Studies History (Michelle R Brooks, CMA; 10/23/2014 9:32 AM) Colonoscopy within last year  Allergies (Michelle R Brooks, CMA; 10/23/2014 9:32 AM) No Known Drug Allergies 08/31/2014  Medication History (Michelle R Brooks, CMA; 10/23/2014 9:32 AM) Benicar (40MG Tablet, Oral) Active. Vitamin B-12 (500MCG Tablet, Oral) Active.  Social History (Michelle R Brooks, CMA; 10/23/2014 9:32 AM) Alcohol use Moderate alcohol use. Caffeine use Carbonated beverages, Tea. No drug use Tobacco use Never smoker.  Family History (Michelle R Brooks, CMA; 10/23/2014 9:32 AM) Cancer Mother. Respiratory Condition Father.    Vitals (Michelle R. Brooks CMA; 10/23/2014 9:31 AM) 10/23/2014 9:30 AM Weight: 236.13 lb Height: 68.7in Body Surface Area: 2.28 m Body Mass Index: 35.17 kg/m BP: 132/86 (Sitting, Left Arm, Standard)     Physical Exam (Dalyah Pla A. Saprina Chuong MD; 10/23/2014 9:52 AM)  General Mental Status-Alert. General Appearance-Consistent with stated age. Hydration-Well hydrated. Voice-Normal.  Integumentary Note: 4 cm lipoma right of miline lower backand iliac crest 3 cm lipoma left lower back mobile    Neurologic Neurologic evaluation reveals -alert and oriented x 3 with no impairment of recent or remote memory. Mental  Status-Normal.  Musculoskeletal Normal Exam - Left-Upper Extremity Strength Normal and Lower Extremity Strength Normal. Normal Exam - Right-Upper Extremity Strength Normal, Lower Extremity Weakness.    Assessment & Plan (Iver Fehrenbach A. Usha Slager MD; 10/23/2014 9:50 AM)  LIPOMA OF BACK (214.8  D17.1) Impression: SPENT 20 MINUTES DISCUSSING MRI AND UPCOMING SURGERY  Current Plans Pt Education - [facility]   Free Text Education/Instructions: discussed with patient and provided information. 

## 2014-11-02 NOTE — Transfer of Care (Signed)
Immediate Anesthesia Transfer of Care Note  Patient: Gregory Porter  Procedure(s) Performed: Procedure(s): EXCISION LOWER BACK MASSES (N/A)  Patient Location: PACU  Anesthesia Type:General  Level of Consciousness: awake, sedated and patient cooperative  Airway & Oxygen Therapy: Patient Spontanous Breathing and Patient connected to face mask oxygen  Post-op Assessment: Report given to RN and Post -op Vital signs reviewed and stable  Post vital signs: Reviewed and stable  Last Vitals:  Filed Vitals:   11/02/14 1216  BP: 140/87  Pulse: 66  Temp: 36.6 C  Resp: 16    Complications: No apparent anesthesia complications

## 2014-11-02 NOTE — H&P (Signed)
The procedure has been discussed with the patient.  Alternative therapies have been discussed with the patient.  Operative risks include bleeding,  Infection,  Organ injury,  Nerve injury,  Blood vessel injury,  DVT,  Pulmonary embolism,  Death,  And possible reoperation.  Medical management risks include worsening of present situation.  The success of the procedure is 50 -90 % at treating patients symptoms.  The patient understands and agrees to proceed.

## 2014-11-03 ENCOUNTER — Encounter (HOSPITAL_BASED_OUTPATIENT_CLINIC_OR_DEPARTMENT_OTHER): Payer: Self-pay | Admitting: Surgery

## 2014-11-03 NOTE — Op Note (Signed)
Gregory Porter, Gregory Porter                ACCOUNT NO.:  [id number]  MEDICAL RECORD NO.:  [id number]  LOCATION:                               FACILITY:  [facility]  PHYSICIAN:  [name] A. Willadene Mounsey, M.D.DATE OF BIRTH:  [birth year]  DATE OF PROCEDURE:  11/02/2014 DATE OF DISCHARGE:  11/02/2014                              OPERATIVE REPORT   PREOPERATIVE DIAGNOSIS:  Lipoma subcutaneous, lower back x2.  POSTOPERATIVE DIAGNOSIS:  Lipoma subcutaneous, lower back x2.  PROCEDURE:  Excision of lower back lipoma subcutaneous x2 measuring 4.5 cm and 3.5 cm respectively.  SURGEON:  [name] A. Carigan Lister, M.D.  ANESTHESIA:  General endotracheal anesthesia with 0.25% Sensorcaine local.  EBL:  Minimal.  SPECIMEN:  Lipoma as above.  DRAINS:  None.  INDICATIONS FOR PROCEDURE:  The patient is a 49 year old male, who has 2 lipoma on his lower back, 1 just to the right of the midline and the 2nd just to the left of the midline.  These were causing discomfort pain and he wished to have removed.  One on the right has been hurting him longer, but one on the left is newer and starting to cause discomfort as well.  We discussed risks of surgery, bleeding, infection, seroma, wound drainage, nerve injury, blood vessel injury, the need for further surgery and recurrence.  He understands the above and agrees to proceed.  DESCRIPTION OF PROCEDURE:  The patient was met in the holding area. Both areas were marked with the help of the patient on his lower back just to the right and left of the midline at the level just above his coccyx.  He was then taken back to the operating room and placed supine on a stretcher, was intubated and placed prone and padded appropriately. The lower back and upper buttock region were prepped and draped in sterile fashion.  Time-out was done.  The one on the right side was done first.  Local anesthesia was infiltrated over the right side of the lipoma.  Incision was made.  Dissection was  carried down to the subcu fat and a multi-lobulated mass was encountered roughly grossly 4 cm in maximal diameter.  This was shelled out easily with good hemostasis and passed off the field.  This wound was closed with deep layer of 3-0 Vicryl and 4-0 Monocryl.  In a similar fashion, the lipoma just to the left of midline at this level was removed.  Transverse incision was made after infiltration of local anesthesia.  Dissection was carried down and a multilobulated lipoma was encountered which was about 3 cm in maximal diameter.  This was removed in its entirety.  The wound was hemostatic with cautery and closed the deep layer with 3-0 Vicryl and 4-0 Monocryl. Liquid adhesive applied to both incisions.  The patient was then placed back supine, extubated, and taken to recovery in satisfactory condition. All final counts were found to be correct.     Keino Placencia A. Angellica Maddison, M.D.     TAC/MEDQ  D:  11/02/2014  T:  11/03/2014  Job:  [id number]

## 2015-06-21 ENCOUNTER — Other Ambulatory Visit: Payer: Self-pay | Admitting: Gastroenterology

## 2015-06-21 DIAGNOSIS — D509 Iron deficiency anemia, unspecified: Secondary | ICD-10-CM | POA: Diagnosis not present

## 2015-06-21 DIAGNOSIS — K228 Other specified diseases of esophagus: Secondary | ICD-10-CM | POA: Diagnosis not present

## 2015-06-21 DIAGNOSIS — K208 Other esophagitis: Secondary | ICD-10-CM | POA: Diagnosis not present

## 2015-06-21 DIAGNOSIS — K219 Gastro-esophageal reflux disease without esophagitis: Secondary | ICD-10-CM | POA: Diagnosis not present

## 2015-07-27 DIAGNOSIS — K921 Melena: Secondary | ICD-10-CM | POA: Diagnosis not present

## 2015-08-22 DIAGNOSIS — I10 Essential (primary) hypertension: Secondary | ICD-10-CM | POA: Diagnosis not present

## 2015-08-22 DIAGNOSIS — D509 Iron deficiency anemia, unspecified: Secondary | ICD-10-CM | POA: Diagnosis not present

## 2015-08-22 DIAGNOSIS — M79671 Pain in right foot: Secondary | ICD-10-CM | POA: Diagnosis not present

## 2015-09-10 DIAGNOSIS — H35042 Retinal micro-aneurysms, unspecified, left eye: Secondary | ICD-10-CM | POA: Diagnosis not present

## 2015-09-10 DIAGNOSIS — H2513 Age-related nuclear cataract, bilateral: Secondary | ICD-10-CM | POA: Diagnosis not present

## 2015-12-24 DIAGNOSIS — E6609 Other obesity due to excess calories: Secondary | ICD-10-CM | POA: Diagnosis not present

## 2015-12-24 DIAGNOSIS — Z0000 Encounter for general adult medical examination without abnormal findings: Secondary | ICD-10-CM | POA: Diagnosis not present

## 2015-12-24 DIAGNOSIS — I10 Essential (primary) hypertension: Secondary | ICD-10-CM | POA: Diagnosis not present

## 2015-12-24 DIAGNOSIS — J45990 Exercise induced bronchospasm: Secondary | ICD-10-CM | POA: Diagnosis not present

## 2015-12-24 DIAGNOSIS — D509 Iron deficiency anemia, unspecified: Secondary | ICD-10-CM | POA: Diagnosis not present

## 2015-12-24 DIAGNOSIS — Z125 Encounter for screening for malignant neoplasm of prostate: Secondary | ICD-10-CM | POA: Diagnosis not present

## 2017-01-13 ENCOUNTER — Inpatient Hospital Stay (HOSPITAL_BASED_OUTPATIENT_CLINIC_OR_DEPARTMENT_OTHER)
Admission: EM | Admit: 2017-01-13 | Discharge: 2017-01-17 | DRG: 389 | Disposition: A | Discharge status: Home / Self Care | Payer: 59 | Attending: Surgery | Admitting: Surgery

## 2017-01-13 ENCOUNTER — Emergency Department (HOSPITAL_BASED_OUTPATIENT_CLINIC_OR_DEPARTMENT_OTHER): Payer: 59

## 2017-01-13 ENCOUNTER — Encounter (HOSPITAL_BASED_OUTPATIENT_CLINIC_OR_DEPARTMENT_OTHER): Payer: Self-pay | Admitting: *Deleted

## 2017-01-13 ENCOUNTER — Other Ambulatory Visit: Payer: Self-pay

## 2017-01-13 DIAGNOSIS — Z79891 Long term (current) use of opiate analgesic: Secondary | ICD-10-CM

## 2017-01-13 DIAGNOSIS — K5669 Other partial intestinal obstruction: Principal | ICD-10-CM | POA: Diagnosis present

## 2017-01-13 DIAGNOSIS — J45990 Exercise induced bronchospasm: Secondary | ICD-10-CM | POA: Diagnosis present

## 2017-01-13 DIAGNOSIS — Z79899 Other long term (current) drug therapy: Secondary | ICD-10-CM

## 2017-01-13 DIAGNOSIS — A09 Infectious gastroenteritis and colitis, unspecified: Secondary | ICD-10-CM | POA: Diagnosis present

## 2017-01-13 DIAGNOSIS — K56600 Partial intestinal obstruction, unspecified as to cause: Secondary | ICD-10-CM | POA: Diagnosis not present

## 2017-01-13 DIAGNOSIS — D509 Iron deficiency anemia, unspecified: Secondary | ICD-10-CM | POA: Diagnosis present

## 2017-01-13 DIAGNOSIS — I10 Essential (primary) hypertension: Secondary | ICD-10-CM | POA: Diagnosis present

## 2017-01-13 DIAGNOSIS — K649 Unspecified hemorrhoids: Secondary | ICD-10-CM | POA: Diagnosis present

## 2017-01-13 DIAGNOSIS — K56609 Unspecified intestinal obstruction, unspecified as to partial versus complete obstruction: Secondary | ICD-10-CM | POA: Diagnosis present

## 2017-01-13 LAB — CBC
HEMATOCRIT: 37.2 % — AB (ref 39.0–52.0)
Hemoglobin: 12.6 g/dL — ABNORMAL LOW (ref 13.0–17.0)
MCH: 29.3 pg (ref 26.0–34.0)
MCHC: 33.9 g/dL (ref 30.0–36.0)
MCV: 86.5 fL (ref 78.0–100.0)
PLATELETS: 183 10*3/uL (ref 150–400)
RBC: 4.3 MIL/uL (ref 4.22–5.81)
RDW: 18.3 % — ABNORMAL HIGH (ref 11.5–15.5)
WBC: 9.3 10*3/uL (ref 4.0–10.5)

## 2017-01-13 LAB — COMPREHENSIVE METABOLIC PANEL
ALT: 21 U/L (ref 17–63)
AST: 21 U/L (ref 15–41)
Albumin: 4.8 g/dL (ref 3.5–5.0)
Alkaline Phosphatase: 76 U/L (ref 38–126)
Anion gap: 12 (ref 5–15)
BUN: 14 mg/dL (ref 6–20)
CHLORIDE: 104 mmol/L (ref 101–111)
CO2: 21 mmol/L — ABNORMAL LOW (ref 22–32)
CREATININE: 1.16 mg/dL (ref 0.61–1.24)
Calcium: 10.2 mg/dL (ref 8.9–10.3)
Glucose, Bld: 134 mg/dL — ABNORMAL HIGH (ref 65–99)
POTASSIUM: 3.9 mmol/L (ref 3.5–5.1)
Sodium: 137 mmol/L (ref 135–145)
Total Bilirubin: 0.5 mg/dL (ref 0.3–1.2)
Total Protein: 8.7 g/dL — ABNORMAL HIGH (ref 6.5–8.1)

## 2017-01-13 LAB — URINALYSIS, ROUTINE W REFLEX MICROSCOPIC
Bilirubin Urine: NEGATIVE
Glucose, UA: NEGATIVE mg/dL
Hgb urine dipstick: NEGATIVE
KETONES UR: 15 mg/dL — AB
LEUKOCYTES UA: NEGATIVE
NITRITE: NEGATIVE
PROTEIN: NEGATIVE mg/dL
Specific Gravity, Urine: 1.015 (ref 1.005–1.030)
pH: 6 (ref 5.0–8.0)

## 2017-01-13 LAB — TROPONIN I: Troponin I: <0.03 ng/mL (ref <0.03)

## 2017-01-13 LAB — LIPASE, BLOOD: LIPASE: 28 U/L (ref 11–51)

## 2017-01-13 MED ORDER — MORPHINE SULFATE (PF) 4 MG/ML IV SOLN
4.0000 mg | Freq: Once | INTRAVENOUS | Status: AC
  Administered 2017-01-13: 4 mg via INTRAVENOUS
  Filled 2017-01-13: qty 1

## 2017-01-13 MED ORDER — SODIUM CHLORIDE 0.9 % IV BOLUS (SEPSIS)
1000.0000 mL | Freq: Once | INTRAVENOUS | Status: AC
  Administered 2017-01-13: 1000 mL via INTRAVENOUS

## 2017-01-13 MED ORDER — IOPAMIDOL (ISOVUE-300) INJECTION 61%
100.0000 mL | Freq: Once | INTRAVENOUS | Status: AC | PRN
  Administered 2017-01-13: 100 mL via INTRAVENOUS

## 2017-01-13 MED ORDER — SODIUM CHLORIDE 0.9 % IV SOLN
INTRAVENOUS | Status: DC
  Administered 2017-01-13: 23:00:00 via INTRAVENOUS

## 2017-01-13 MED ORDER — MORPHINE SULFATE (PF) 4 MG/ML IV SOLN
4.0000 mg | Freq: Once | INTRAVENOUS | Status: AC
  Administered 2017-01-14: 4 mg via INTRAVENOUS
  Filled 2017-01-13: qty 1

## 2017-01-13 MED ORDER — METOCLOPRAMIDE HCL 5 MG/ML IJ SOLN
5.0000 mg | Freq: Once | INTRAMUSCULAR | Status: AC
  Administered 2017-01-13: 5 mg via INTRAVENOUS
  Filled 2017-01-13: qty 2

## 2017-01-13 MED ORDER — ONDANSETRON HCL 4 MG/2ML IJ SOLN
4.0000 mg | Freq: Once | INTRAMUSCULAR | Status: AC
  Administered 2017-01-13: 4 mg via INTRAVENOUS
  Filled 2017-01-13: qty 2

## 2017-01-13 NOTE — ED Triage Notes (Signed)
Abdominal pain since this am. Epigastric. Pale. Restless.

## 2017-01-13 NOTE — ED Provider Notes (Signed)
[facility] EMERGENCY DEPARTMENT Provider Note   CSN: [id number] Arrival date & time: 01/13/17  1800     History   Chief Complaint Chief Complaint  Patient presents with  . Abdominal Pain    HPI Gregory Porter is a 51 y.o. male.  HPI Complains of  epigastric pain nonradiating onset 9 AM today feels like spasms, waxes and wanes.  Nothing makes symptoms better or worse.  He is vomited one time since onset of pain.  No chest pain no shortness of breath he is not nauseated present.  He treated himself with milk of magnesia, without relief.  Last bowel movement today normal.  No urinary symptoms.  No other associated symptoms. Past Medical History:  Diagnosis Date  . Asthma    with exercise  . Hypertension     Patient Active Problem List   Diagnosis Date Noted  . Plantar fasciitis of right foot 12/20/2013  . Metatarsalgia of right foot 12/20/2013  . HAV (hallux abducto valgus) 12/20/2013    Past Surgical History:  Procedure Laterality Date  . MASS EXCISION N/A 11/02/2014   Procedure: EXCISION LOWER BACK MASSES;  Surgeon: Erroll Luna, MD;  Location: [facility] [place];  Service: General;  Laterality: N/A;       Home Medications    Prior to Admission medications   Medication Sig Start Date End Date Taking? Authorizing Provider  BENICAR 20 MG tablet 40 mg.  11/19/13   [provider]  oxyCODONE-acetaminophen (ROXICET) 5-325 MG per tablet Take 1 tablet by mouth every 4 (four) hours as needed. 11/02/14   Erroll Luna, MD  PROAIR HFA 108 [id number] BASE) MCG/ACT inhaler  10/05/13   [provider]    Family History No family history on file.  Social History Social History   Tobacco Use  . Smoking status: Never Smoker  . Smokeless tobacco: Never Used  Substance Use Topics  . Alcohol use: Yes    Alcohol/week: 2.4 oz    Types: 4 Cans of beer per week    Comment: per day  . Drug use: No     Allergies   Patient has no known  allergies.   Review of Systems Review of Systems  Constitutional: Negative.   HENT: Negative.   Respiratory: Negative.   Cardiovascular: Negative.   Gastrointestinal: Positive for abdominal pain and vomiting.  Musculoskeletal: Negative.   Skin: Negative.   Neurological: Negative.   Psychiatric/Behavioral: Negative.   All other systems reviewed and are negative.    Physical Exam Updated Vital Signs BP (!) 144/86   Pulse 60   Temp 97.8 F (36.6 C) (Oral)   Resp 20   Ht 5\' 9"  (1.753 m)   Wt 93.9 kg (207 lb)   SpO2 100%   BMI 30.57 kg/m   Physical Exam  Constitutional: He appears well-developed and well-nourished.  HENT:  Head: Normocephalic and atraumatic.  Eyes: Conjunctivae are normal. Pupils are equal, round, and reactive to light.  Neck: Neck supple. No tracheal deviation present. No thyromegaly present.  Cardiovascular: Normal rate and regular rhythm.  No murmur heard. Pulmonary/Chest: Effort normal and breath sounds normal.  Abdominal: Soft. Bowel sounds are normal. He exhibits no distension. There is tenderness.  Tenderness at epigastrium  Musculoskeletal: Normal range of motion. He exhibits no edema or tenderness.  Neurological: He is alert. Coordination normal.  Skin: Skin is warm and dry. No rash noted.  Psychiatric: He has a normal mood and affect.  Nursing note and vitals reviewed.  ED Treatments / Results  Labs (all labs ordered are listed, but only abnormal results are displayed) Labs Reviewed  COMPREHENSIVE METABOLIC PANEL - Abnormal; Notable for the following components:      Result Value   CO2 21 (*)    Glucose, Bld 134 (*)    Total Protein 8.7 (*)    All other components within normal limits  CBC - Abnormal; Notable for the following components:   Hemoglobin 12.6 (*)    HCT 37.2 (*)    RDW 18.3 (*)    All other components within normal limits  LIPASE, BLOOD  TROPONIN I  URINALYSIS, ROUTINE W REFLEX MICROSCOPIC   Results for orders  placed or performed during the hospital encounter of 01/13/17  Lipase, blood  Result Value Ref Range   Lipase 28 11 - 51 U/L  Comprehensive metabolic panel  Result Value Ref Range   Sodium 137 135 - 145 mmol/L   Potassium 3.9 3.5 - 5.1 mmol/L   [place] 104 101 - 111 mmol/L   CO2 21 (L) 22 - 32 mmol/L   Glucose, Bld 134 (H) 65 - 99 mg/dL   BUN 14 6 - 20 mg/dL   Creatinine, Ser 1.16 0.61 - 1.24 mg/dL   Calcium 10.2 8.9 - 10.3 mg/dL   Total Protein 8.7 (H) 6.5 - 8.1 g/dL   Albumin 4.8 3.5 - 5.0 g/dL   AST 21 15 - 41 U/L   ALT 21 17 - 63 U/L   Alkaline Phosphatase 76 38 - 126 U/L   Total Bilirubin 0.5 0.3 - 1.2 mg/dL   GFR calc non Af Amer >60 >60 mL/min   GFR calc Af Amer >60 >60 mL/min   Anion gap 12 5 - 15  CBC  Result Value Ref Range   WBC 9.3 4.0 - 10.5 K/uL   RBC 4.30 4.22 - 5.81 MIL/uL   Hemoglobin 12.6 (L) 13.0 - 17.0 g/dL   HCT 37.2 (L) 39.0 - 52.0 %   MCV 86.5 78.0 - 100.0 fL   MCH 29.3 26.0 - 34.0 pg   MCHC 33.9 30.0 - 36.0 g/dL   RDW 18.3 (H) 11.5 - 15.5 %   Platelets 183 150 - 400 K/uL  Urinalysis, Routine w reflex microscopic  Result Value Ref Range   Color, Urine YELLOW YELLOW   APPearance CLEAR CLEAR   Specific Gravity, Urine 1.015 1.005 - 1.030   pH 6.0 5.0 - 8.0   Glucose, UA NEGATIVE NEGATIVE mg/dL   Hgb urine dipstick NEGATIVE NEGATIVE   Bilirubin Urine NEGATIVE NEGATIVE   Ketones, ur 15 (A) NEGATIVE mg/dL   Protein, ur NEGATIVE NEGATIVE mg/dL   Nitrite NEGATIVE NEGATIVE   Leukocytes, UA NEGATIVE NEGATIVE  Troponin I  Result Value Ref Range   Troponin I <0.03 <0.03 ng/mL   Ct Abdomen Pelvis W Contrast  Result Date: 01/13/2017 CLINICAL DATA:  Epigastric pain, cramping, and vomiting started today. EXAM: CT ABDOMEN AND PELVIS WITH CONTRAST TECHNIQUE: Multidetector CT imaging of the abdomen and pelvis was performed using the standard protocol following bolus administration of intravenous contrast. CONTRAST:  126mL ISOVUE-300 IOPAMIDOL (ISOVUE-300)  INJECTION 61% COMPARISON:  None. FINDINGS: Lower chest: The lung bases are clear. Hepatobiliary: No focal liver abnormality is seen. No gallstones, gallbladder wall thickening, or biliary dilatation. Pancreas: Unremarkable. No pancreatic ductal dilatation or surrounding inflammatory changes. Spleen: Minimal subcapsular fluid around the spleen. No focal lesions. No splenic enlargement. Adrenals/Urinary Tract: No adrenal gland nodules. Kidneys are symmetrical in size. Renal nephrograms  are homogeneous and symmetrical. No hydronephrosis or hydroureter. Bladder wall is somewhat thickened which may indicate cystitis. Correlation with urinalysis recommended. Stomach/Bowel: Stomach is unremarkable. Mildly dilated fluid-filled small bowel with decompressed terminal ileum. Transition zone is in the right lower quadrant. Bowel loops at the site of transition demonstrates some wall thickening with edema in the mesentery. This may represent infectious or inflammatory etiology. Consider infectious or inflammatory enteritis causing obstruction. Colon is decompressed with scattered stool. Appendix is normal. Vascular/Lymphatic: No significant vascular findings are present. No enlarged abdominal or pelvic lymph nodes. Reproductive: Prostate calcifications are present. No prostate enlargement. Other: No free air in the abdomen. Abdominal wall musculature appears intact. Musculoskeletal: No acute or significant osseous findings. IMPRESSION: 1. Dilated fluid-filled small bowel with transition zone in the right lower quadrant. Focal wall thickening at the zone of transition with mesenteric edema. This suggests partial small bowel obstruction, possibly due to infectious or inflammatory enteritis. 2. Bladder wall is somewhat thickened which may indicate cystitis. Correlation with urinalysis recommended. 3. Minimal subcapsular fluid around the spleen.  Uncertain etiology. Electronically Signed   By: Lucienne Capers M.D.   On: 01/13/2017  22:08    EKG  EKG Interpretation  Date/Time:  Tuesday January 13 2017 18:37:46 EST Ventricular Rate:  69 PR Interval:    QRS Duration: 86 QT Interval:  407 QTC Calculation: 436 R Axis:   67 Text Interpretation:  Sinus rhythm Abnormal R-wave progression, early transition No significant change since last tracing Confirmed by Orlie Dakin [id number]) on 01/13/2017 7:17:06 PM       Radiology No results found.  Procedures Procedures (including critical care time)  Medications Ordered in ED Medications  sodium chloride 0.9 % bolus 1,000 mL (not administered)  morphine 4 MG/ML injection 4 mg (not administered)  metoCLOPramide (REGLAN) injection 5 mg (not administered)     Initial Impression / Assessment and Plan / ED Course  I have reviewed the triage vital signs and the nursing notes.  Pertinent labs & imaging results that were available during my care of the patient were reviewed by me and considered in my medical decision making (see chart for details).     10:45 PM patient is more comfortable after treatment with intravenous opioids and antiemetics.  I consulted Dr. Lucia Gaskins from general surgery who accepts patient in transfer to [facility] long emergency department.  Dr. Lucia Gaskins should be called when patient arrives admit Dr.Rees of transfer. Patient has no evidence of cystitis.  Normal urinalysis Final Clinical Impressions(s) / ED Diagnoses  Diagnosis partial small bowel obstruction Final diagnoses:  None    ED Discharge Orders    None       Orlie Dakin, MD 01/13/17 2251

## 2017-01-13 NOTE — ED Notes (Signed)
Pt given urinal to attempt urine.

## 2017-01-13 NOTE — ED Provider Notes (Signed)
11:50 PM  Assumed care from Dr. Winfred Leeds at [facility].  Patient is a 51 year old male who was sent as a transfer to see the general surgeon Dr. Lucia Gaskins.  Complaint of abdominal pain, cramping, nausea and vomiting that started today.  A CT scan showed a small bowel obstruction with transition zone in the right lower quadrant with mesenteric edema.  Patient is stable.  We will place a nasogastric tube and give him pain medication.  Surgery has been paged to see patient in the ER for admission.   Lempi Edwin, Delice Bison, DO 01/14/17 [phone]

## 2017-01-13 NOTE — ED Notes (Signed)
Patient transported to CT 

## 2017-01-14 ENCOUNTER — Inpatient Hospital Stay (HOSPITAL_COMMUNITY): Payer: 59

## 2017-01-14 ENCOUNTER — Emergency Department (HOSPITAL_COMMUNITY): Payer: 59

## 2017-01-14 DIAGNOSIS — K56600 Partial intestinal obstruction, unspecified as to cause: Secondary | ICD-10-CM | POA: Diagnosis present

## 2017-01-14 DIAGNOSIS — A09 Infectious gastroenteritis and colitis, unspecified: Secondary | ICD-10-CM | POA: Diagnosis present

## 2017-01-14 DIAGNOSIS — Z79899 Other long term (current) drug therapy: Secondary | ICD-10-CM | POA: Diagnosis not present

## 2017-01-14 DIAGNOSIS — K649 Unspecified hemorrhoids: Secondary | ICD-10-CM | POA: Diagnosis present

## 2017-01-14 DIAGNOSIS — J45990 Exercise induced bronchospasm: Secondary | ICD-10-CM | POA: Diagnosis present

## 2017-01-14 DIAGNOSIS — K5669 Other partial intestinal obstruction: Secondary | ICD-10-CM | POA: Diagnosis present

## 2017-01-14 DIAGNOSIS — K56609 Unspecified intestinal obstruction, unspecified as to partial versus complete obstruction: Secondary | ICD-10-CM | POA: Diagnosis present

## 2017-01-14 DIAGNOSIS — D509 Iron deficiency anemia, unspecified: Secondary | ICD-10-CM | POA: Diagnosis present

## 2017-01-14 DIAGNOSIS — Z79891 Long term (current) use of opiate analgesic: Secondary | ICD-10-CM | POA: Diagnosis not present

## 2017-01-14 DIAGNOSIS — I10 Essential (primary) hypertension: Secondary | ICD-10-CM | POA: Diagnosis present

## 2017-01-14 LAB — CBC
HEMATOCRIT: 35.2 % — AB (ref 39.0–52.0)
HEMOGLOBIN: 11.8 g/dL — AB (ref 13.0–17.0)
MCH: 29.6 pg (ref 26.0–34.0)
MCHC: 33.5 g/dL (ref 30.0–36.0)
MCV: 88.4 fL (ref 78.0–100.0)
Platelets: 149 10*3/uL — ABNORMAL LOW (ref 150–400)
RBC: 3.98 MIL/uL — AB (ref 4.22–5.81)
RDW: 18.7 % — ABNORMAL HIGH (ref 11.5–15.5)
WBC: 8 10*3/uL (ref 4.0–10.5)

## 2017-01-14 LAB — BASIC METABOLIC PANEL
ANION GAP: 6 (ref 5–15)
BUN: 11 mg/dL (ref 6–20)
CALCIUM: 8.9 mg/dL (ref 8.9–10.3)
CO2: 24 mmol/L (ref 22–32)
Chloride: 108 mmol/L (ref 101–111)
Creatinine, Ser: 1.11 mg/dL (ref 0.61–1.24)
GLUCOSE: 149 mg/dL — AB (ref 65–99)
POTASSIUM: 4 mmol/L (ref 3.5–5.1)
Sodium: 138 mmol/L (ref 135–145)

## 2017-01-14 MED ORDER — ONDANSETRON 4 MG PO TBDP: 4.0000 mg | ORAL_TABLET | Freq: Four times a day (QID) | ORAL | Status: DC | PRN

## 2017-01-14 MED ORDER — SODIUM CHLORIDE 0.45 % IV SOLN: INTRAVENOUS | Status: DC

## 2017-01-14 MED ORDER — MORPHINE SULFATE (PF) 2 MG/ML IV SOLN
1.0000 mg | INTRAVENOUS | Status: DC | PRN
  Administered 2017-01-14 (×4): 2 mg via INTRAVENOUS
  Filled 2017-01-14 (×4): qty 1

## 2017-01-14 MED ORDER — POTASSIUM CHLORIDE IN NACL 20-0.45 MEQ/L-% IV SOLN
INTRAVENOUS | Status: DC
  Administered 2017-01-14 – 2017-01-17 (×6): via INTRAVENOUS
  Filled 2017-01-14 (×8): qty 1000

## 2017-01-14 MED ORDER — ONDANSETRON HCL 4 MG/2ML IJ SOLN
4.0000 mg | Freq: Four times a day (QID) | INTRAMUSCULAR | Status: DC | PRN
  Administered 2017-01-14: 4 mg via INTRAVENOUS
  Filled 2017-01-14: qty 2

## 2017-01-14 MED ORDER — KCL IN DEXTROSE-NACL 20-5-0.45 MEQ/L-%-% IV SOLN
INTRAVENOUS | Status: DC
  Administered 2017-01-14: 03:00:00 via INTRAVENOUS
  Filled 2017-01-14: qty 1000

## 2017-01-14 MED ORDER — HYDRALAZINE HCL 20 MG/ML IJ SOLN: 5.0000 mg | Freq: Four times a day (QID) | INTRAMUSCULAR | Status: DC | PRN

## 2017-01-14 MED ORDER — HEPARIN SODIUM (PORCINE) 5000 UNIT/ML IJ SOLN
5000.0000 [IU] | Freq: Three times a day (TID) | INTRAMUSCULAR | Status: DC
  Administered 2017-01-14 – 2017-01-17 (×9): 5000 [IU] via SUBCUTANEOUS
  Filled 2017-01-14 (×9): qty 1

## 2017-01-14 MED ORDER — DIATRIZOATE MEGLUMINE & SODIUM 66-10 % PO SOLN
90.0000 mL | Freq: Once | ORAL | Status: AC
  Administered 2017-01-14: 90 mL via NASOGASTRIC
  Filled 2017-01-14: qty 90

## 2017-01-14 NOTE — Progress Notes (Signed)
Called and received report.  

## 2017-01-14 NOTE — H&P (Signed)
Re:   Gregory Porter DOB:   1965-08-05 MRN:   [id number]  Chief Complaint Abdominal pain  ASSESEMENT AND PLAN: 1.  Partial SBO vs ileus  In virgin abdomen  Plan NG tube, IV fluids, repeat x-rays and physical exam in the morning.  2.  HTN 3.  Exercise induced asthma  Chief Complaint  Patient presents with  . Abdominal Pain   PHYSICIAN REQUESTING CONSULTATION:   [place]  HISTORY OF PRESENT ILLNESS: Gregory Porter is a 51 y.o. (DOB: 1966-01-16) White male whose primary care physician is Gregory Lass, MD.   The patient has no history of chronic disease.  He had [place] food last night.  He had vague abdominal pain around 9 AM this morning.  He was able to go to work, but because of increasing epigastric discomfort left work about 2 PM.  He tried taking some magnesium citrate to help with his bowels.  His last bowel movement was this morning.  He is vomited once.  He came to the [place] [facility] ER because of worsening epigastric abdominal pain.  He has no history of abdominal surgery.  He has no history of stomach, liver, colon disease.  He has had a prior colonoscopy about 2 years ago through the [place] office.     He has intentionally lost about 40 pounds (247 pounds to 208 pounds) over the last year by cutting out soft drinks and eating less pasta.  CT scan of abdomen - 01/13/2017 - 1. Dilated fluid-filled small bowel with transition zone in the right lower quadrant. Focal wall thickening at the zone of transition with mesenteric edema. This suggests partial small bowel obstruction, possibly due to infectious or inflammatory enteritis.     2. Bladder wall is somewhat thickened which may indicate cystitis. Correlation with urinalysis recommended.  3. Minimal subcapsular fluid around the spleen.  Uncertain etiology.  WBC - 01/13/2017 - 9,300   Past Medical History:  Diagnosis Date  . Asthma    with exercise  . Hypertension       Past Surgical  History:  Procedure Laterality Date  . MASS EXCISION N/A 11/02/2014   Procedure: EXCISION LOWER BACK MASSES;  Surgeon: Erroll Luna, MD;  Location: [facility];  Service: General;  Laterality: N/A;      Current Facility-Administered Medications  Medication Dose Route Frequency Provider Last Rate Last Dose  . 0.9 %  sodium chloride infusion   Intravenous Continuous Orlie Dakin, MD 125 mL/hr at 01/13/17 2305    . morphine 4 MG/ML injection 4 mg  4 mg Intravenous Once Ward, Kristen N, DO       Current Outpatient Medications  Medication Sig Dispense Refill  . BENICAR 20 MG tablet 40 mg.     . oxyCODONE-acetaminophen (ROXICET) 5-325 MG per tablet Take 1 tablet by mouth every 4 (four) hours as needed. 30 tablet 0  . PROAIR HFA 108 (90 BASE) MCG/ACT inhaler        No Known Allergies  REVIEW OF SYSTEMS: Skin:  No history of rash.  No history of abnormal moles. Infection:  No history of hepatitis or HIV.  No history of MRSA. Neurologic:  No history of stroke.  No history of seizure.  No history of headaches. Cardiac:  Hypertension. No history of heart disease.  No history of prior cardiac catheterization.  No history of seeing a cardiologist. Pulmonary:  Does not smoke cigarettes.  No asthma or bronchitis.  No OSA/CPAP.  Endocrine:  No diabetes. No thyroid disease. Gastrointestinal:  See HPI Urologic:  No history of kidney stones.  No history of bladder infections. Musculoskeletal:  Lipoma of lower back excised by Dr. Brantley Stage - 11/02/2014 Hematologic:  No bleeding disorder.  No history of anemia.  Not anticoagulated. Psycho-social:  The patient is oriented.   The patient has no obvious psychologic or social impairment to understanding our conversation and plan.  SOCIAL and FAMILY HISTORY: Unmarried Lives by himself. Works as an Engineer, technical sales.  No family history of bowel obstruction.  PHYSICAL EXAM: BP (!) 134/93 (BP Location: Right Arm)   Pulse 84   Temp 99.1  F (37.3 C) (Oral)   Resp 16   Ht 5\' 9"  (1.753 m)   Wt 93.9 kg (207 lb)   SpO2 97%   BMI 30.57 kg/m   General: WN WM who is alert and generally healthy appearing.  Skin:  Inspection and palpation - no mass or rash. Eyes:  Conjunctiva and lids unremarkable.            Pupils are equal Ears, Nose, Mouth, and Throat:  Ears and nose unremarkable            Lips and teeth are unremarable. Neck: Supple. No mass, trachea midline.  No thyroid mass. Lymph Nodes:  No supraclavicular, cervical, or inguinal nodes. Lungs: Normal respiratory effort.    Clear to auscultation and symmetric breath sounds. Heart:  Palpation of the heart is normal.            Auscultation: RRR. No murmur or rub.  Abdomen: Soft. No mass. No hernia.             No abdominal scars.  Modest distension.  Rare BS.  No guarding or rebound. Rectal: Not done. Musculoskeletal:  Good muscle strength and ROM  in upper and lower extremities. Neurologic:  Grossly intact to motor and sensory function. Psychiatric: Normal judgement and insight. Behavior is normal.            Oriented to time, person, place.   DATA REVIEWED, COUNSELING AND COORDINATION OF CARE: Epic notes reviewed. Counseling and coordination of care exceeded more than 50% of the time spent with patient. Total time spent with patient and charting: 45 minutes  Alphonsa Overall, MD,  [facility], [address]  [place], [place]    [place] Phone:  [phone] FAX:  [phone]

## 2017-01-14 NOTE — Discharge Instructions (Signed)
Small Bowel Obstruction °A small bowel obstruction means that something is blocking the small bowel. The small bowel is also called the small intestine. It is the long tube that connects the stomach to the colon. An obstruction will stop food and fluids from passing through the small bowel. Treatment depends on what is causing the problem and how bad the problem is. °Follow these instructions at home: °· Get a lot of rest. °· Follow your diet as told by your doctor. You may need to: °? Only drink clear liquids until you start to get better. °? Avoid solid foods as told by your doctor. °· Take over-the-counter and prescription medicines only as told by your doctor. °· Keep all follow-up visits as told by your doctor. This is important. °Contact a doctor if: °· You have a fever. °· You have chills. °Get help right away if: °· You have pain or cramps that get worse. °· You throw up (vomit) blood. °· You have a feeling of being sick to your stomach (nausea) that does not go away. °· You cannot stop throwing up. °· You cannot drink fluids. °· You feel confused. °· You feel dry or thirsty (dehydrated). °· Your belly gets more bloated. °· You feel weak or you pass out (faint). °This information is not intended to replace advice given to you by your health care provider. Make sure you discuss any questions you have with your health care provider. °Document Released: 03/20/2004 Document Revised: 10/08/2015 Document Reviewed: 04/06/2014 °Elsevier Interactive Patient Education © 2018 Elsevier Inc. ° °

## 2017-01-14 NOTE — Progress Notes (Signed)
Patient ID: Gregory Porter, male   DOB: March 16, 1965, 51 y.o.   MRN: [id number]  [facility] Surgery Progress Note     Subjective: CC-  Patient states that he is feeling better today than yesterday. Denies any current abdominal pain. Bloating has improved. Denies n/v. No flatus or BM yet.  Objective: Vital signs in last 24 hours: Temp:  [97.8 F (36.6 C)-99.1 F (37.3 C)] 98.5 F (36.9 C) (11/21 0510) Pulse Rate:  [60-98] 84 (11/21 0510) Resp:  [15-40] 19 (11/21 0510) BP: (119-151)/(73-99) 131/74 (11/21 0510) SpO2:  [94 %-100 %] 100 % (11/21 0510) Weight:  [207 lb (93.9 kg)-211 lb 6.7 oz (95.9 kg)] 211 lb 6.7 oz (95.9 kg) (11/21 0259) Last BM Date: 01/12/17  Intake/Output from previous day: 11/20 0701 - 11/21 0700 In: 981.3 [I.V.:981.3] Out: 175 [Urine:175] Intake/Output this shift: No intake/output data recorded.  PE: Gen:  Alert, NAD, pleasant HEENT: EOM's intact, pupils equal and round Card:  RRR, no M/G/R heard Pulm:  CTAB, no W/R/R, effort normal Abd: Soft, NT/ND, +BS in all 4 quadrants Ext:  No erythema, edema, or tenderness BUE/BLE  Psych: A&Ox3  Skin: no rashes noted, warm and dry  Lab Results:  Recent Labs    01/13/17 1835 01/14/17 0622  WBC 9.3 8.0  HGB 12.6* 11.8*  HCT 37.2* 35.2*  PLT 183 149*   BMET Recent Labs    01/13/17 1835 01/14/17 0622  NA 137 138  K 3.9 4.0  CL 104 108  CO2 21* 24  GLUCOSE 134* 149*  BUN 14 11  CREATININE 1.16 1.11  CALCIUM 10.2 8.9   PT/INR No results for input(s): LABPROT, INR in the last 72 hours. CMP     Component Value Date/Time   NA 138 01/14/2017 0622   K 4.0 01/14/2017 0622   CL 108 01/14/2017 0622   CO2 24 01/14/2017 0622   GLUCOSE 149 (H) 01/14/2017 0622   BUN 11 01/14/2017 0622   CREATININE 1.11 01/14/2017 0622   CALCIUM 8.9 01/14/2017 0622   PROT 8.7 (H) 01/13/2017 1835   ALBUMIN 4.8 01/13/2017 1835   AST 21 01/13/2017 1835   ALT 21 01/13/2017 1835   ALKPHOS 76 01/13/2017 1835   BILITOT  0.5 01/13/2017 1835   GFRNONAA >60 01/14/2017 0622   GFRAA >60 01/14/2017 0622   Lipase     Component Value Date/Time   LIPASE 28 01/13/2017 1835       Studies/Results: Ct Abdomen Pelvis W Contrast  Result Date: 01/13/2017 CLINICAL DATA:  Epigastric pain, cramping, and vomiting started today. EXAM: CT ABDOMEN AND PELVIS WITH CONTRAST TECHNIQUE: Multidetector CT imaging of the abdomen and pelvis was performed using the standard protocol following bolus administration of intravenous contrast. CONTRAST:  154mL ISOVUE-300 IOPAMIDOL (ISOVUE-300) INJECTION 61% COMPARISON:  None. FINDINGS: Lower chest: The lung bases are clear. Hepatobiliary: No focal liver abnormality is seen. No gallstones, gallbladder wall thickening, or biliary dilatation. Pancreas: Unremarkable. No pancreatic ductal dilatation or surrounding inflammatory changes. Spleen: Minimal subcapsular fluid around the spleen. No focal lesions. No splenic enlargement. Adrenals/Urinary Tract: No adrenal gland nodules. Kidneys are symmetrical in size. Renal nephrograms are homogeneous and symmetrical. No hydronephrosis or hydroureter. Bladder wall is somewhat thickened which may indicate cystitis. Correlation with urinalysis recommended. Stomach/Bowel: Stomach is unremarkable. Mildly dilated fluid-filled small bowel with decompressed terminal ileum. Transition zone is in the right lower quadrant. Bowel loops at the site of transition demonstrates some wall thickening with edema in the mesentery. This may represent infectious or  inflammatory etiology. Consider infectious or inflammatory enteritis causing obstruction. Colon is decompressed with scattered stool. Appendix is normal. Vascular/Lymphatic: No significant vascular findings are present. No enlarged abdominal or pelvic lymph nodes. Reproductive: Prostate calcifications are present. No prostate enlargement. Other: No free air in the abdomen. Abdominal wall musculature appears intact.  Musculoskeletal: No acute or significant osseous findings. IMPRESSION: 1. Dilated fluid-filled small bowel with transition zone in the right lower quadrant. Focal wall thickening at the zone of transition with mesenteric edema. This suggests partial small bowel obstruction, possibly due to infectious or inflammatory enteritis. 2. Bladder wall is somewhat thickened which may indicate cystitis. Correlation with urinalysis recommended. 3. Minimal subcapsular fluid around the spleen.  Uncertain etiology. Electronically Signed   By: Lucienne Capers M.D.   On: 01/13/2017 22:08   Dg Abd Portable 1 View  Result Date: 01/14/2017 CLINICAL DATA:  NG tube placement EXAM: PORTABLE ABDOMEN - 1 VIEW COMPARISON:  CT abdomen and pelvis 01/13/2017 FINDINGS: Enteric tube tip in the left upper quadrant consistent with location in the upper stomach. Gas distention of small bowel consistent with small bowel obstruction. Residual contrast material in the renal collecting systems. IMPRESSION: Enteric tube tip in the left upper quadrant consistent with location in the upper stomach. Gaseous distention of small bowel consistent with obstruction. Electronically Signed   By: Lucienne Capers M.D.   On: 01/14/2017 01:21    Anti-infectives: Anti-infectives (From admission, onward)   None       Assessment/Plan HTN - hold home meds while NPO, hydralazine PRN  pSBO versus Ileus - no prior h/o abdominal surgery - CT scan shows dilated fluid-filled small bowel with transition zone in the right lower quadrant; focal wall thickening at the zone of transition with mesenteric edema; partial small bowel obstruction, possibly due to infectious or inflammatory enteritis - NG tube placed 11/20 - WBC 8, afebrile  ID - none FEN - IVF, NPO/NGT VTE - SCDs, heparin Foley - none Follow up - TBD  Plan - Repeat abdominal xray pending this AM. Continue NG tube to LIWS. Ok to clamp NG tube to allow patient to ambulate. If no improvement  will plan to start small bowel protocol.   LOS: 0 days    Wellington Hampshire , [facility] Surgery 01/14/2017, 8:05 AM Pager: [phone] Consults: [phone] Mon-Fri 7:00 am-4:30 pm Sat-Sun 7:00 am-11:30 am

## 2017-01-14 NOTE — Progress Notes (Signed)
Patient ID: Gregory Porter, male   DOB: [birth year], 51 y.o.   MRN: [id number]  No return in bowel function as of yet. Will start patient on small bowel protocol.  Brooke A Meuth

## 2017-01-14 NOTE — ED Notes (Signed)
Assigned @0224  room [id number]

## 2017-01-15 ENCOUNTER — Inpatient Hospital Stay (HOSPITAL_COMMUNITY): Payer: 59

## 2017-01-15 LAB — CBC
HCT: 34.3 % — ABNORMAL LOW (ref 39.0–52.0)
Hemoglobin: 11.1 g/dL — ABNORMAL LOW (ref 13.0–17.0)
MCH: 29.4 pg (ref 26.0–34.0)
MCHC: 32.4 g/dL (ref 30.0–36.0)
MCV: 90.7 fL (ref 78.0–100.0)
PLATELETS: 144 10*3/uL — AB (ref 150–400)
RBC: 3.78 MIL/uL — ABNORMAL LOW (ref 4.22–5.81)
RDW: 18.7 % — AB (ref 11.5–15.5)
WBC: 5.3 10*3/uL (ref 4.0–10.5)

## 2017-01-15 LAB — BASIC METABOLIC PANEL
Anion gap: 6 (ref 5–15)
BUN: 13 mg/dL (ref 6–20)
CALCIUM: 8.6 mg/dL — AB (ref 8.9–10.3)
CO2: 23 mmol/L (ref 22–32)
CREATININE: 1.05 mg/dL (ref 0.61–1.24)
Chloride: 108 mmol/L (ref 101–111)
GFR calc non Af Amer: >60 mL/min (ref >60)
Glucose, Bld: 98 mg/dL (ref 65–99)
Potassium: 4.3 mmol/L (ref 3.5–5.1)
SODIUM: 137 mmol/L (ref 135–145)

## 2017-01-15 MED ORDER — MENTHOL 3 MG MT LOZG
1.0000 | LOZENGE | OROMUCOSAL | Status: DC | PRN
  Filled 2017-01-15: qty 9

## 2017-01-15 NOTE — Progress Notes (Signed)
Patient reports BM. RN visualized. Bright red blood filled bottom of toilet, unable to see stool, but patient reports it was small. Questioned patient about BRB. Patient reports this is not new. He states he has been told he is anemic. He states "Doctors have been trying to figure out where the blood is coming from"

## 2017-01-15 NOTE — Progress Notes (Signed)
General Surgery [facility]  Assessment & Plan: HD#3 - pSBO versus Ileus - no prior h/o abdominal surgery - CT scan shows dilated fluid-filled small bowel with transition zone in the right lower quadrant; focal wall thickening at the zone of transition with mesenteric edema; partial small bowel obstruction, possibly due to infectious or inflammatory enteritis - history of anemia and GI bleeding evaluated by [name] - colonoscopy x 2, capsule endoscopy - may need GI consultation during this admission - NG tube placed 11/20 - will clamp today and allow sips of clear liquids - BM, flatus this AM - blood in BM per nurse and patient  ID - none FEN - IVF VTE - SCDs, heparin  Will clamp NG tube this AM and allow sips of clear liquids.  Encouraged ambulation in halls.  Monitor BM's for signs of further GI bleeding.  May need GI consultation during this admission.        [name], MD, [facility]       Office: [phone]    Chief Complaint: SBO, GI bleeding  Subjective: Patient in bed, comfortable.  Denies pain.  Flatus and BM this AM.  Blood in stool.  Denies nausea or emesis.  Objective: Vital signs in last 24 hours: Temp:  [98 F (36.7 C)-98.8 F (37.1 C)] 98.3 F (36.8 C) (11/22 0552) Pulse Rate:  [63-71] 63 (11/22 0552) Resp:  [18] 18 (11/22 0552) BP: (121-136)/(64-85) 127/78 (11/22 0552) SpO2:  [97 %-100 %] 98 % (11/22 0552) Last BM Date: 01/13/17  Intake/Output from previous day: 11/21 0701 - 11/22 0700 In: 760.4 [I.V.:760.4] Out: 2100 [Urine:800; Emesis/NG output:1300] Intake/Output this shift: No intake/output data recorded.  Physical Exam: HEENT - sclerae clear, mucous membranes moist Neck - soft Chest - clear bilaterally Cor - RRR Abdomen - soft, mild distension; BS present; non-tender; NG output thin bilious Ext - no edema, non-tender Neuro - alert & oriented, no focal deficits  Lab  Results:  Recent Labs    01/14/17 0622 01/15/17 0546  WBC 8.0 5.3  HGB 11.8* 11.1*  HCT 35.2* 34.3*  PLT 149* 144*   BMET Recent Labs    01/14/17 0622 01/15/17 0546  NA 138 137  K 4.0 4.3  CL 108 108  CO2 24 23  GLUCOSE 149* 98  BUN 11 13  CREATININE 1.11 1.05  CALCIUM 8.9 8.6*   PT/INR No results for input(s): LABPROT, INR in the last 72 hours. Comprehensive Metabolic Panel:    Component Value Date/Time   NA 137 01/15/2017 0546   NA 138 01/14/2017 0622   K 4.3 01/15/2017 0546   K 4.0 01/14/2017 0622   CL 108 01/15/2017 0546   CL 108 01/14/2017 0622   CO2 23 01/15/2017 0546   CO2 24 01/14/2017 0622   BUN 13 01/15/2017 0546   BUN 11 01/14/2017 0622   CREATININE 1.05 01/15/2017 0546   CREATININE 1.11 01/14/2017 0622   GLUCOSE 98 01/15/2017 0546   GLUCOSE 149 (H) 01/14/2017 0622   CALCIUM 8.6 (L) 01/15/2017 0546   CALCIUM 8.9 01/14/2017 0622   AST 21 01/13/2017 1835   AST 15 10/27/2014 1500   ALT 21 01/13/2017 1835   ALT 24 10/27/2014 1500   ALKPHOS 76 01/13/2017 1835   ALKPHOS 73 10/27/2014 1500   BILITOT 0.5 01/13/2017 1835   BILITOT 0.6 10/27/2014 1500   PROT 8.7 (H) 01/13/2017 1835   PROT 7.2  10/27/2014 1500   ALBUMIN 4.8 01/13/2017 1835   ALBUMIN 3.8 10/27/2014 1500    Studies/Results: Ct Abdomen Pelvis W Contrast  Result Date: 01/13/2017 CLINICAL DATA:  Epigastric pain, cramping, and vomiting started today. EXAM: CT ABDOMEN AND PELVIS WITH CONTRAST TECHNIQUE: Multidetector CT imaging of the abdomen and pelvis was performed using the standard protocol following bolus administration of intravenous contrast. CONTRAST:  121mL ISOVUE-300 IOPAMIDOL (ISOVUE-300) INJECTION 61% COMPARISON:  None. FINDINGS: Lower chest: The lung bases are clear. Hepatobiliary: No focal liver abnormality is seen. No gallstones, gallbladder wall thickening, or biliary dilatation. Pancreas: Unremarkable. No pancreatic ductal dilatation or surrounding inflammatory changes. Spleen:  Minimal subcapsular fluid around the spleen. No focal lesions. No splenic enlargement. Adrenals/Urinary Tract: No adrenal gland nodules. Kidneys are symmetrical in size. Renal nephrograms are homogeneous and symmetrical. No hydronephrosis or hydroureter. Bladder wall is somewhat thickened which may indicate cystitis. Correlation with urinalysis recommended. Stomach/Bowel: Stomach is unremarkable. Mildly dilated fluid-filled small bowel with decompressed terminal ileum. Transition zone is in the right lower quadrant. Bowel loops at the site of transition demonstrates some wall thickening with edema in the mesentery. This may represent infectious or inflammatory etiology. Consider infectious or inflammatory enteritis causing obstruction. Colon is decompressed with scattered stool. Appendix is normal. Vascular/Lymphatic: No significant vascular findings are present. No enlarged abdominal or pelvic lymph nodes. Reproductive: Prostate calcifications are present. No prostate enlargement. Other: No free air in the abdomen. Abdominal wall musculature appears intact. Musculoskeletal: No acute or significant osseous findings. IMPRESSION: 1. Dilated fluid-filled small bowel with transition zone in the right lower quadrant. Focal wall thickening at the zone of transition with mesenteric edema. This suggests partial small bowel obstruction, possibly due to infectious or inflammatory enteritis. 2. Bladder wall is somewhat thickened which may indicate cystitis. Correlation with urinalysis recommended. 3. Minimal subcapsular fluid around the spleen.  Uncertain etiology. Electronically Signed   By: Lucienne Capers M.D.   On: 01/13/2017 22:08   Dg Abd 2 Views  Result Date: 01/14/2017 CLINICAL DATA:  Bowel obstruction. EXAM: ABDOMEN - 2 VIEW COMPARISON:  Radiograph of same day.  CT scan of November 13, 2016. FINDINGS: Nasogastric tube tip is seen in expected position of distal stomach. Dilated small bowel loops are noted with  air-fluid levels concerning for distal small bowel obstruction. Residual contrast is noted in the urinary bladder. No abnormal calcifications are noted. IMPRESSION: Nasogastric tube tip seen in expected position of distal stomach. Findings consistent with distal small bowel obstruction. Electronically Signed   By: Marijo Conception, M.D.   On: 01/14/2017 09:40   Dg Abd Portable 1v-small Bowel Obstruction Protocol-initial, 8 Hr Delay  Result Date: 01/15/2017 CLINICAL DATA:  Small bowel protocol 8 hours post contrast film EXAM: PORTABLE ABDOMEN - 1 VIEW COMPARISON:  01/14/2017 FINDINGS: Enteric tube tip in the mid abdomen consistent with location in the duodenum. Scattered gas-filled dilated small bowel with small bowel wall thickening. Appearance consistent with small bowel obstruction and enteritis. Contrast material is demonstrated in the colon suggesting that there is no complete obstruction. IMPRESSION: Contrast material in the colon suggesting no complete obstruction. Dilated gas-filled small bowel with small bowel fold thickening suggesting small bowel obstruction with enteritis. Electronically Signed   By: Lucienne Capers M.D.   On: 01/15/2017 02:41   Dg Abd Portable 1 View  Result Date: 01/14/2017 CLINICAL DATA:  NG tube placement EXAM: PORTABLE ABDOMEN - 1 VIEW COMPARISON:  CT abdomen and pelvis 01/13/2017 FINDINGS: Enteric tube tip in the left upper  quadrant consistent with location in the upper stomach. Gas distention of small bowel consistent with small bowel obstruction. Residual contrast material in the renal collecting systems. IMPRESSION: Enteric tube tip in the left upper quadrant consistent with location in the upper stomach. Gaseous distention of small bowel consistent with obstruction. Electronically Signed   By: Lucienne Capers M.D.   On: 01/14/2017 01:21      [place] M 01/15/2017  Patient ID: Gregory Porter, male   DOB: 01-Sep-1965, 51 y.o.   MRN: [id number]

## 2017-01-15 NOTE — Progress Notes (Signed)
Patient had a small bowel movement mixed with red bright blood

## 2017-01-16 NOTE — Consult Note (Signed)
Subjective:   HPI  The patient is a 51 year old male who was admitted to the hospital with complaints of abdominal pain. He was found to have evidence on CT scan of dilated fluid-filled small bowel with transition zone in the right lower quadrant and focal thickening with mesenteric edema. Small bowel obstruction which was partial was felt to be the cause for this was possibly due to infectious or inflammatory enteritis. Patient has no prior history of abdominal surgery. He does have a history of some minimal rectal bleeding most likely hemorrhoidal in nature. He also has a history of iron deficiency anemia as well as a 12 deficiency.  We have previously evaluated this patient for anemia. In October 2015 he had a normal colonoscopy. In April 2017 he had another normal colonoscopy and a normal EGD. In June 2017 he had a capsule endoscopy which showed a few erosions in the distal duodenum and proximal jejunum. He had a normal TTG in 2017.  He feels much better today. His NG tube is out. He is passing some gas and stool. His abdomen is nontender.  Review of Systems No chest pain or shortness of breath  Past Medical History:  Diagnosis Date  . Asthma    with exercise  . Hypertension    Past Surgical History:  Procedure Laterality Date  . MASS EXCISION N/A 11/02/2014   Procedure: EXCISION LOWER BACK MASSES;  Surgeon: Erroll Luna, MD;  Location: [place];  Service: General;  Laterality: N/A;   Social History   Socioeconomic History  . Marital status: Divorced    Spouse name: Not on file  . Number of children: Not on file  . Years of education: Not on file  . Highest education level: Not on file  Social Needs  . Financial resource strain: Not on file  . Food insecurity - worry: Not on file  . Food insecurity - inability: Not on file  . Transportation needs - medical: Not on file  . Transportation needs - non-medical: Not on file  Occupational History  . Not on file   Tobacco Use  . Smoking status: Never Smoker  . Smokeless tobacco: Never Used  Substance and Sexual Activity  . Alcohol use: Yes    Alcohol/week: 2.4 oz    Types: 4 Cans of beer per week    Comment: per day  . Drug use: No  . Sexual activity: Not on file  Other Topics Concern  . Not on file  Social History Narrative  . Not on file   family history is not on file.  Current Facility-Administered Medications:  .  0.45 % NaCl with KCl 20 mEq / L infusion, , Intravenous, Continuous, Gerkin, Sherren Mocha, MD, Last Rate: 75 mL/hr at 01/16/17 0850 .  heparin injection 5,000 Units, 5,000 Units, Subcutaneous, Q8H, Alphonsa Overall, MD, 5,000 Units at 01/16/17 9024 .  hydrALAZINE (APRESOLINE) injection 5 mg, 5 mg, Intravenous, Q6H PRN, Meuth, Brooke A, PA-C .  menthol-cetylpyridinium (CEPACOL) lozenge 3 mg, 1 lozenge, Oral, PRN, Armandina Gemma, MD .  morphine 2 MG/ML injection 1-3 mg, 1-3 mg, Intravenous, Q2H PRN, Alphonsa Overall, MD, 2 mg at 01/14/17 2017 .  ondansetron (ZOFRAN-ODT) disintegrating tablet 4 mg, 4 mg, Oral, Q6H PRN **OR** ondansetron (ZOFRAN) injection 4 mg, 4 mg, Intravenous, Q6H PRN, Alphonsa Overall, MD, 4 mg at 01/14/17 0973 No Known Allergies   Objective:     BP (!) 144/83 (BP Location: Right Arm)   Pulse (!) 54   Temp 98.4  F (36.9 C) (Oral)   Resp 18   Ht 5\' 9"  (1.753 m)   Wt 95.9 kg (211 lb 6.7 oz)   SpO2 98%   BMI 31.22 kg/m   Alert and oriented  No acute distress  Heart regular rhythm no murmurs  Lungs clear  Abdomen: Bowel sounds present, soft, nontender  Laboratory No components found for: D1    Assessment:     Partial small bowel obstruction most likely secondary to infectious enteritis  History of anemia, both iron deficient and B-12 deficient  Minimal rectal bleeding most likely of an anorectal source. He has had 2 colonoscopies in the past.      Plan:     Recommend conservative management at this time. Advance diet as tolerated. It appears  clinically that his small bowel obstruction has resolved. I would recommend follow-up as an outpatient with a small bowel series or CT enterography in a few weeks. He does not need further endoscopic evaluation.

## 2017-01-16 NOTE — Progress Notes (Signed)
General Surgery [facility]  Assessment & Plan: HD#4 - small bowel obstruction -no prior h/o abdominal surgery - CT scan shows dilated fluid-filled small bowel with transition zone in the right lower quadrant; focal wall thickening at the zone of transition with mesenteric edema;partial small bowel obstruction, possibly due to infectious or inflammatory enteritis - NG tube clamped for 24 hours - will remove today and allow clear liquid diet - OOB, ambulation GI Bleeding - history of anemia and GI bleeding evaluated by [facility] - colonoscopy x 2, capsule endoscopy - continued blood noted with BM's - Hgb 11.1 - will request GI consultation and recommendations         [name], MD, [facility]       Office: [phone]    Chief Complaint: Small bowel obstruction, GI bleeding  Subjective: Patient in bed.  Tolerated NG clamped for 24 hours.  Limited clear liquid intake.  Ambulatory.  Objective: Vital signs in last 24 hours: Temp:  [97.9 F (36.6 C)-98.8 F (37.1 C)] 98.4 F (36.9 C) (11/23 0455) Pulse Rate:  [54-58] 54 (11/23 0455) Resp:  [18] 18 (11/23 0455) BP: (124-144)/(61-87) 144/83 (11/23 0455) SpO2:  [98 %-100 %] 98 % (11/23 0455) Last BM Date: 01/15/17  Intake/Output from previous day: 11/22 0701 - 11/23 0700 In: 660 [P.O.:660] Out: 2 [Stool:2] Intake/Output this shift: No intake/output data recorded.  Physical Exam: HEENT - sclerae clear, mucous membranes moist Neck - soft Chest - clear bilaterally Cor - RRR Abdomen - soft without distension; non-tender Ext - no edema, non-tender Neuro - alert & oriented, no focal deficits  Lab Results:  Recent Labs    01/14/17 0622 01/15/17 0546  WBC 8.0 5.3  HGB 11.8* 11.1*  HCT 35.2* 34.3*  PLT 149* 144*   BMET Recent Labs    01/14/17 0622 01/15/17 0546  NA 138 137  K 4.0 4.3  CL 108 108  CO2 24 23  GLUCOSE 149* 98  BUN 11 13   CREATININE 1.11 1.05  CALCIUM 8.9 8.6*   PT/INR No results for input(s): LABPROT, INR in the last 72 hours. Comprehensive Metabolic Panel:    Component Value Date/Time   NA 137 01/15/2017 0546   NA 138 01/14/2017 0622   K 4.3 01/15/2017 0546   K 4.0 01/14/2017 0622   CL 108 01/15/2017 0546   CL 108 01/14/2017 0622   CO2 23 01/15/2017 0546   CO2 24 01/14/2017 0622   BUN 13 01/15/2017 0546   BUN 11 01/14/2017 0622   CREATININE 1.05 01/15/2017 0546   CREATININE 1.11 01/14/2017 0622   GLUCOSE 98 01/15/2017 0546   GLUCOSE 149 (H) 01/14/2017 0622   CALCIUM 8.6 (L) 01/15/2017 0546   CALCIUM 8.9 01/14/2017 0622   AST 21 01/13/2017 1835   AST 15 10/27/2014 1500   ALT 21 01/13/2017 1835   ALT 24 10/27/2014 1500   ALKPHOS 76 01/13/2017 1835   ALKPHOS 73 10/27/2014 1500   BILITOT 0.5 01/13/2017 1835   BILITOT 0.6 10/27/2014 1500   PROT 8.7 (H) 01/13/2017 1835   PROT 7.2 10/27/2014 1500   ALBUMIN 4.8 01/13/2017 1835   ALBUMIN 3.8 10/27/2014 1500    Studies/Results: Dg Abd 2 Views  Result Date: 01/14/2017 CLINICAL DATA:  Bowel obstruction. EXAM: ABDOMEN - 2 VIEW COMPARISON:  Radiograph of same day.  CT scan of November 13, 2016. FINDINGS: Nasogastric tube tip is seen in expected  position of distal stomach. Dilated small bowel loops are noted with air-fluid levels concerning for distal small bowel obstruction. Residual contrast is noted in the urinary bladder. No abnormal calcifications are noted. IMPRESSION: Nasogastric tube tip seen in expected position of distal stomach. Findings consistent with distal small bowel obstruction. Electronically Signed   By: Marijo Conception, Porter.D.   On: 01/14/2017 09:40   Dg Abd Portable 1v-small Bowel Obstruction Protocol-initial, 8 Hr Delay  Result Date: 01/15/2017 CLINICAL DATA:  Small bowel protocol 8 hours post contrast film EXAM: PORTABLE ABDOMEN - 1 VIEW COMPARISON:  01/14/2017 FINDINGS: Enteric tube tip in the mid abdomen consistent with  location in the duodenum. Scattered gas-filled dilated small bowel with small bowel wall thickening. Appearance consistent with small bowel obstruction and enteritis. Contrast material is demonstrated in the colon suggesting that there is no complete obstruction. IMPRESSION: Contrast material in the colon suggesting no complete obstruction. Dilated gas-filled small bowel with small bowel fold thickening suggesting small bowel obstruction with enteritis. Electronically Signed   By: Lucienne Capers Porter.D.   On: 01/15/2017 02:41      Gregory Porter 01/16/2017  Patient ID: Gregory Porter, male   DOB: 1965-09-11, 51 y.o.   MRN: [id number]

## 2017-01-17 NOTE — Progress Notes (Signed)
Assessment unchanged. Pt verbalized understanding of dc instructions through teach back including when to follow up with GI doctor as well as PCP. Understands to call MD if any problems occur prior to appointment. No scripts at dc. Discharged via foot per request to front entrance accompanied by NT.

## 2017-01-17 NOTE — Discharge Summary (Signed)
Physician Discharge Summary [facility]  Patient ID: Gregory Porter MRN: [id number] DOB/AGE: [id number] y.o.  Admit date: 01/13/2017 Discharge date: 01/17/2017  Admission Diagnoses:  Small bowel obstruction, enteritis, GI bleeding  Discharge Diagnoses:  Active Problems:   Bowel obstruction [facility])   Discharged Condition: good  Hospital Course: patient admitted with SBO and thickening of distal small bowel.  Improved with bowel rest and IV fluids.  Diet slowly advanced and tolerated.  Blood noted per rectum.  GI consultant (Dr. Penelope Coop) has been following and evaluating.  GI saw patient 11/23 and made recommendations.  Clinically improving and advanced to regular diet.  Prepared for discharge home on HD#5.  Consults: GI  Treatments: IV hydration  Discharge Exam: Blood pressure 126/85, pulse 60, temperature 98.2 F (36.8 C), temperature source Oral, resp. rate 18, height 5\' 9"  (1.753 m), weight 95.9 kg (211 lb 6.7 oz), SpO2 98 %.  See progress note 11/24  Disposition: Home  Discharge Instructions    Diet - low sodium heart healthy   Complete by:  As directed    Increase activity slowly   Complete by:  As directed    No wound care   Complete by:  As directed      Allergies as of 01/17/2017   No Known Allergies     Medication List    TAKE these medications   acetaminophen 650 MG CR tablet Commonly known as:  TYLENOL Take 650 mg by mouth daily.   ferrous sulfate 325 (65 FE) MG EC tablet Take 325 mg by mouth daily with breakfast.   olmesartan 40 MG tablet Commonly known as:  BENICAR Take 40 mg by mouth daily.   PROAIR HFA 108 (90 Base) MCG/ACT inhaler Generic drug:  albuterol   vitamin B-12 100 MCG tablet Commonly known as:  CYANOCOBALAMIN Take 100 mcg by mouth daily.      Follow-up Information    Wonda Horner, MD. Schedule an appointment as soon as possible for a visit in 3 week(s).   Specialty:  Gastroenterology Contact  information: [address] [place] [address] [phone]           [name], MD, [facility] Office: [phone]   Signed: [name] 01/17/2017, 8:56 AM

## 2017-01-17 NOTE — Progress Notes (Signed)
  General Surgery [facility]  Assessment & Plan: HD#5 -small bowel obstruction -tolerating full liquid diet - will advance to regular - OOB, ambulation GI Bleeding - continued blood noted with BM's, likely hemorrhoidal - appreciate GI consultation and recommendations (Dr. Penelope Coop)  Will offer regular breakfast.  If tolerated, plan discharge home today.  Follow up with Dr. Penelope Coop.  Will see surgical team prn.         [name], MD, [facility]       Office: [phone]    Chief Complaint: Small bowel obstruction, enteritis  Subjective: Patient feels well.  No complaints.  Wants to eat [place] toast and bacon.  Wants to go home today.  Objective: Vital signs in last 24 hours: Temp:  [98.2 F (36.8 C)-98.5 F (36.9 C)] 98.2 F (36.8 C) (11/24 0547) Pulse Rate:  [60-63] 60 (11/24 0547) Resp:  [18] 18 (11/24 0547) BP: (126-145)/(84-89) 126/85 (11/24 0547) SpO2:  [98 %] 98 % (11/24 0547) Last BM Date: 01/16/17  Intake/Output from previous day: 11/23 0701 - 11/24 0700 In: 2280 [P.O.:1380; I.V.:900] Out: 250 [Urine:250] Intake/Output this shift: No intake/output data recorded.  Physical Exam: HEENT - sclerae clear, mucous membranes moist Abdomen - soft without distension; BS present; non-tender; no mass Ext - no edema, non-tender Neuro - alert & oriented, no focal deficits  Lab Results:  Recent Labs    01/15/17 0546  WBC 5.3  HGB 11.1*  HCT 34.3*  PLT 144*   BMET Recent Labs    01/15/17 0546  NA 137  K 4.3  CL 108  CO2 23  GLUCOSE 98  BUN 13  CREATININE 1.05  CALCIUM 8.6*   PT/INR No results for input(s): LABPROT, INR in the last 72 hours. Comprehensive Metabolic Panel:    Component Value Date/Time   NA 137 01/15/2017 0546   NA 138 01/14/2017 0622   K 4.3 01/15/2017 0546   K 4.0 01/14/2017 0622   CL 108 01/15/2017 0546   CL 108 01/14/2017 0622   CO2 23 01/15/2017 0546   CO2 24  01/14/2017 0622   BUN 13 01/15/2017 0546   BUN 11 01/14/2017 0622   CREATININE 1.05 01/15/2017 0546   CREATININE 1.11 01/14/2017 0622   GLUCOSE 98 01/15/2017 0546   GLUCOSE 149 (H) 01/14/2017 0622   CALCIUM 8.6 (L) 01/15/2017 0546   CALCIUM 8.9 01/14/2017 0622   AST 21 01/13/2017 1835   AST 15 10/27/2014 1500   ALT 21 01/13/2017 1835   ALT 24 10/27/2014 1500   ALKPHOS 76 01/13/2017 1835   ALKPHOS 73 10/27/2014 1500   BILITOT 0.5 01/13/2017 1835   BILITOT 0.6 10/27/2014 1500   PROT 8.7 (H) 01/13/2017 1835   PROT 7.2 10/27/2014 1500   ALBUMIN 4.8 01/13/2017 1835   ALBUMIN 3.8 10/27/2014 1500    Studies/Results: No results found.    Gregory Porter M 01/17/2017  Patient ID: Gregory Porter, male   DOB: [birth year], 51 y.o.   MRN: [id number]

## 2017-04-09 DIAGNOSIS — N183 Chronic kidney disease, stage 3 (moderate): Secondary | ICD-10-CM | POA: Diagnosis not present

## 2017-04-09 DIAGNOSIS — E6609 Other obesity due to excess calories: Secondary | ICD-10-CM | POA: Diagnosis not present

## 2017-04-09 DIAGNOSIS — D649 Anemia, unspecified: Secondary | ICD-10-CM | POA: Diagnosis not present

## 2017-04-09 DIAGNOSIS — I10 Essential (primary) hypertension: Secondary | ICD-10-CM | POA: Diagnosis not present

## 2018-01-11 DIAGNOSIS — Z125 Encounter for screening for malignant neoplasm of prostate: Secondary | ICD-10-CM | POA: Diagnosis not present

## 2018-01-11 DIAGNOSIS — Z0000 Encounter for general adult medical examination without abnormal findings: Secondary | ICD-10-CM | POA: Diagnosis not present

## 2018-01-11 DIAGNOSIS — D649 Anemia, unspecified: Secondary | ICD-10-CM | POA: Diagnosis not present

## 2018-01-11 DIAGNOSIS — Z13220 Encounter for screening for lipoid disorders: Secondary | ICD-10-CM | POA: Diagnosis not present

## 2018-01-11 DIAGNOSIS — N183 Chronic kidney disease, stage 3 (moderate): Secondary | ICD-10-CM | POA: Diagnosis not present

## 2018-01-11 DIAGNOSIS — A6000 Herpesviral infection of urogenital system, unspecified: Secondary | ICD-10-CM | POA: Diagnosis not present

## 2018-01-11 DIAGNOSIS — J45990 Exercise induced bronchospasm: Secondary | ICD-10-CM | POA: Diagnosis not present

## 2018-01-11 DIAGNOSIS — E6609 Other obesity due to excess calories: Secondary | ICD-10-CM | POA: Diagnosis not present

## 2018-01-11 DIAGNOSIS — I10 Essential (primary) hypertension: Secondary | ICD-10-CM | POA: Diagnosis not present

## 2018-01-11 DIAGNOSIS — Z79899 Other long term (current) drug therapy: Secondary | ICD-10-CM | POA: Diagnosis not present

## 2018-10-02 IMAGING — DX DG ABDOMEN 2V
3 series · 3 of 3 positions shown · non-contrast
Comparison: Radiograph of same day.  CT scan November 13, 2016.

CLINICAL DATA: Bowel obstruction.

EXAM:
ABDOMEN - 2 VIEW

[abdomen erect (1 of 2)]
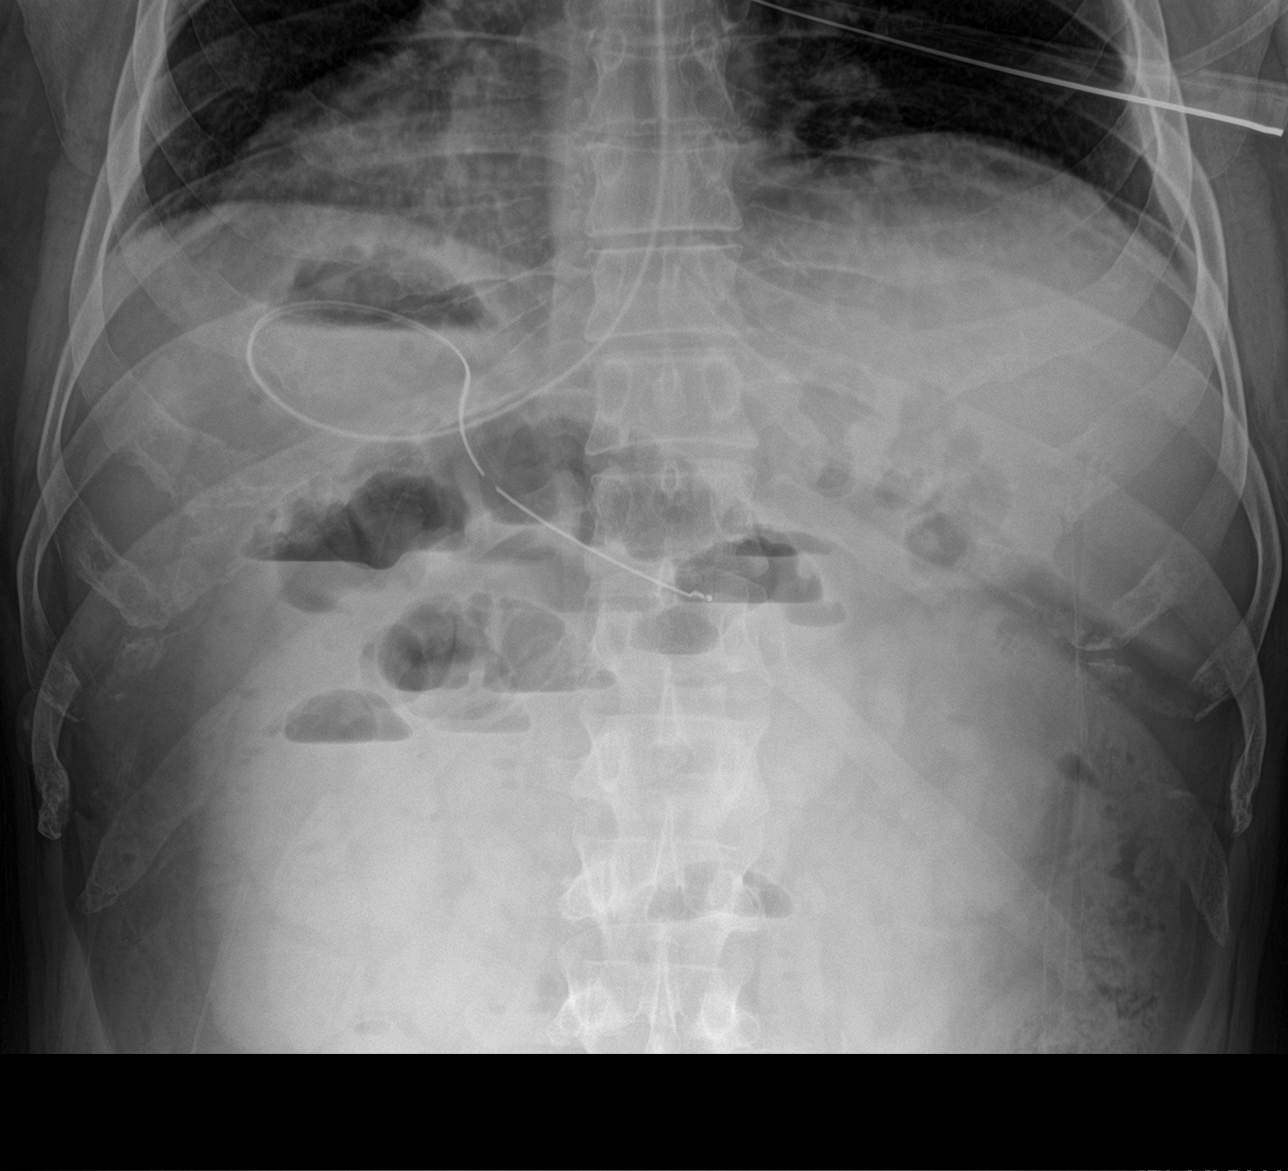

[abdomen supine]
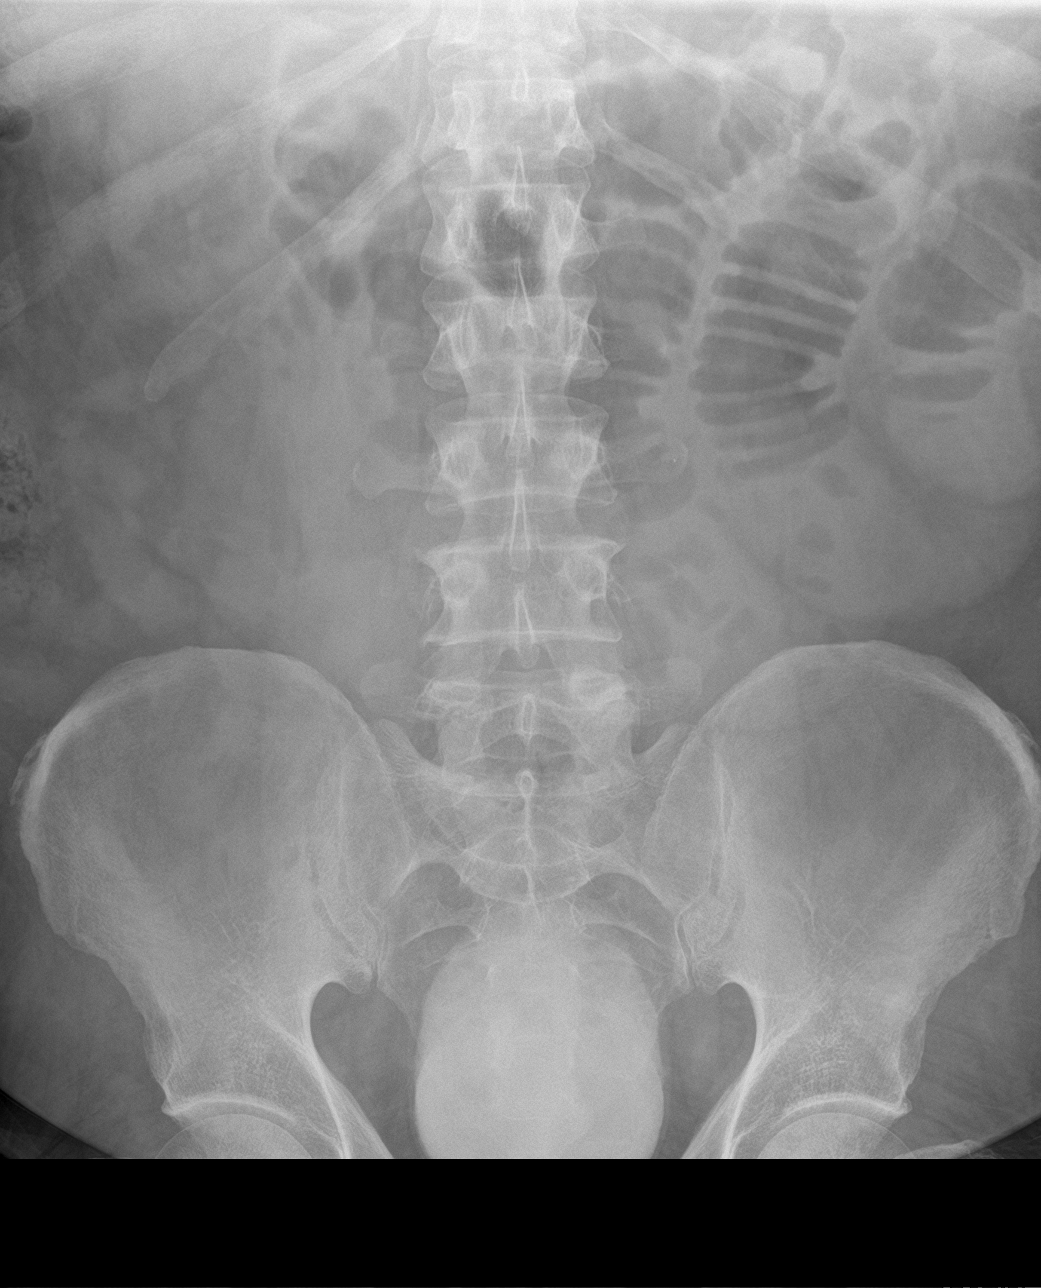

[abdomen erect (2 of 2)]
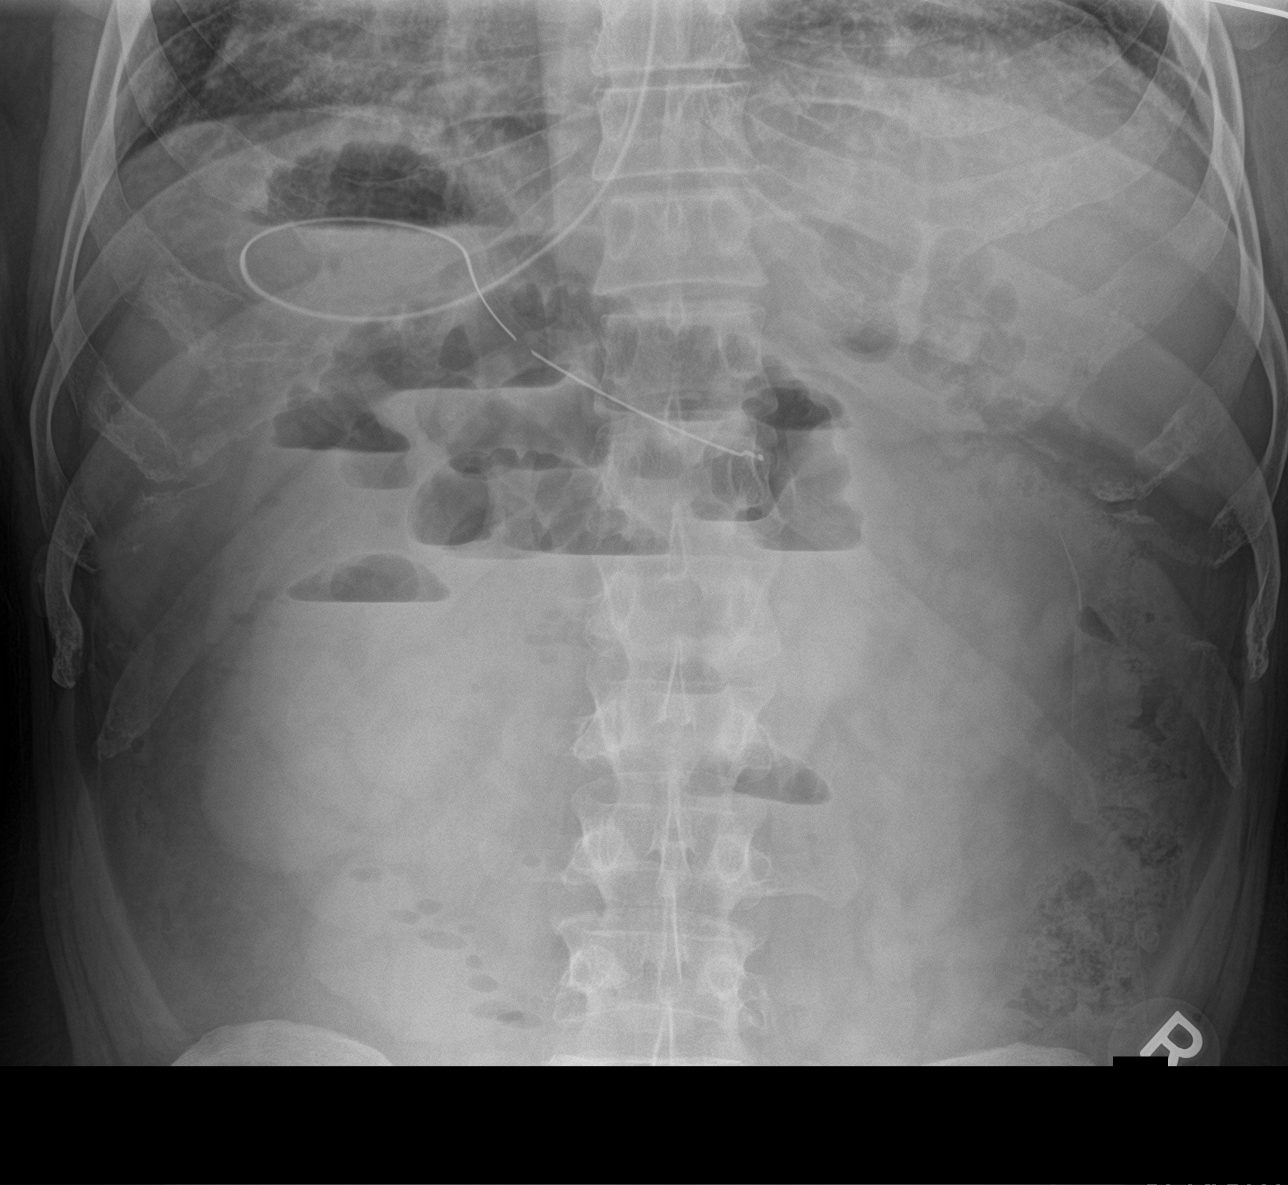

[3 of 3 positions shown; findings below may reference images not displayed]

FINDINGS: Nasogastric tube tip is seen in expected position of distal stomach.
Dilated small bowel loops are noted with air-fluid levels concerning
for distal small bowel obstruction. Residual contrast is noted in
the urinary bladder. No abnormal calcifications are noted.
IMPRESSION: Nasogastric tube tip seen in expected position of distal stomach.
Findings consistent with distal small bowel obstruction.

## 2018-10-02 IMAGING — DX DG ABD PORTABLE 1V
1 series · 1 of 1 positions shown · non-contrast
Comparison: CT abdomen and pelvis 01/13/2017

CLINICAL DATA: NG tube placement

EXAM:
PORTABLE ABDOMEN - 1 VIEW

[abdomen kub]
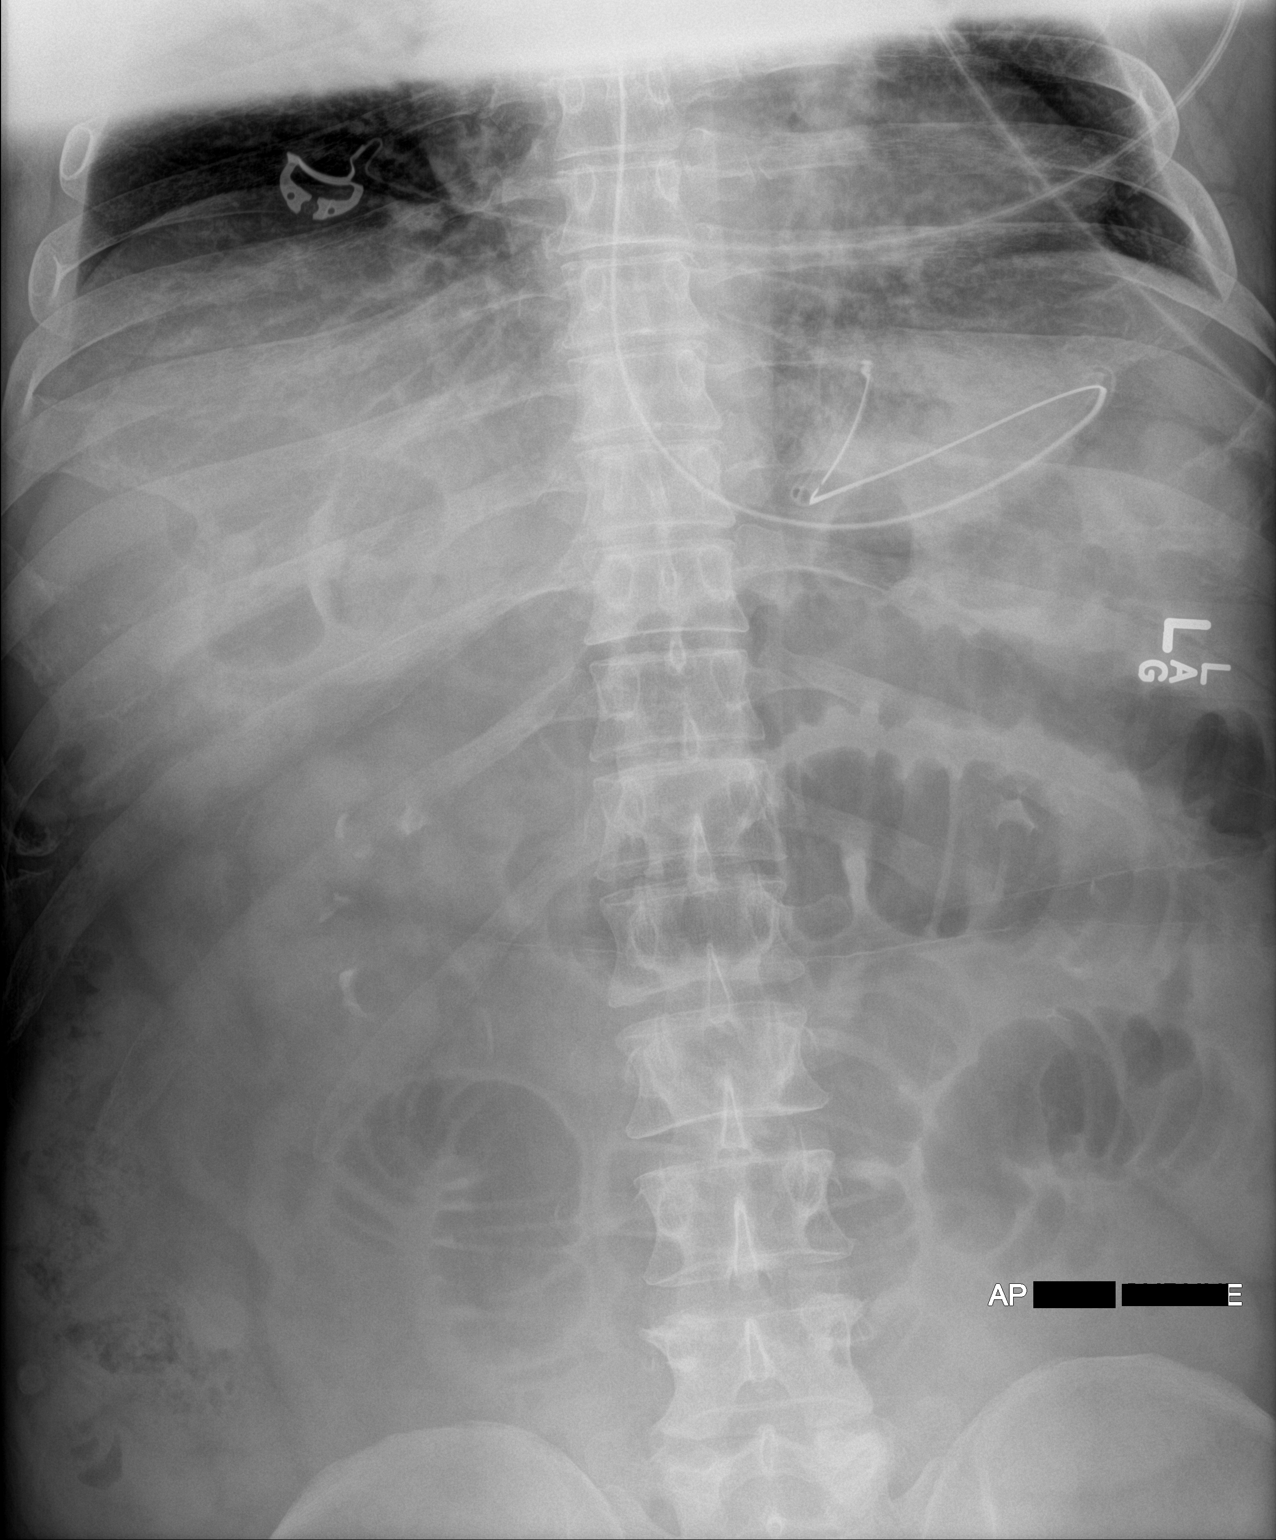

[1 of 1 positions shown; findings below may reference images not displayed]

FINDINGS: Enteric tube tip in the left upper quadrant consistent with location
in the upper stomach. Gas distention of small bowel consistent with
small bowel obstruction. Residual contrast material in the renal
collecting systems.
IMPRESSION: Enteric tube tip in the left upper quadrant consistent with location
in the upper stomach. Gaseous distention of small bowel consistent
with obstruction.

## 2019-01-05 DIAGNOSIS — H2513 Age-related nuclear cataract, bilateral: Secondary | ICD-10-CM | POA: Diagnosis not present

## 2019-01-05 DIAGNOSIS — H40013 Open angle with borderline findings, low risk, bilateral: Secondary | ICD-10-CM | POA: Diagnosis not present

## 2019-02-07 DIAGNOSIS — D649 Anemia, unspecified: Secondary | ICD-10-CM | POA: Diagnosis not present

## 2019-02-07 DIAGNOSIS — J45990 Exercise induced bronchospasm: Secondary | ICD-10-CM | POA: Diagnosis not present

## 2019-02-07 DIAGNOSIS — I10 Essential (primary) hypertension: Secondary | ICD-10-CM | POA: Diagnosis not present

## 2019-02-07 DIAGNOSIS — Z0000 Encounter for general adult medical examination without abnormal findings: Secondary | ICD-10-CM | POA: Diagnosis not present

## 2019-03-23 ENCOUNTER — Ambulatory Visit: Payer: Self-pay

## 2019-03-23 ENCOUNTER — Encounter: Payer: Self-pay | Admitting: Family Medicine

## 2019-03-23 ENCOUNTER — Other Ambulatory Visit: Payer: Self-pay

## 2019-03-23 ENCOUNTER — Ambulatory Visit: Payer: BC Managed Care – PPO | Admitting: Family Medicine

## 2019-03-23 VITALS — BP 130/86 | HR 76 | Ht 69.0 in | Wt 235.0 lb

## 2019-03-23 DIAGNOSIS — M79671 Pain in right foot: Secondary | ICD-10-CM

## 2019-03-23 NOTE — Progress Notes (Signed)
Gregory Porter - 54 y.o. male MRN [id number]  Date of birth: 1965-10-07  SUBJECTIVE:  Including CC & ROS.  Chief Complaint  Patient presents with  . Foot Pain    right foot    Gregory Porter is a 54 y.o. male that is presenting with right foot pain.  The pain is occurring over the dorsum of the forefoot and midfoot.  He experienced this after he stepped awkwardly at work.  This happened about 2 to 3 weeks ago.  Since that time he has had ongoing pain despite therapies to date.  No numbness or tingling.  Review of the right foot x-ray from 1/6 shows old healed fracture of the distal right second and third metatarsals.   Review of Systems See HPI   HISTORY: Past Medical, Surgical, Social, and Family History Reviewed & Updated per EMR.   Pertinent Historical Findings include:  Past Medical History:  Diagnosis Date  . Asthma    with exercise  . Hypertension     Past Surgical History:  Procedure Laterality Date  . MASS EXCISION N/A 11/02/2014   Procedure: EXCISION LOWER BACK MASSES;  Surgeon: Erroll Luna, MD;  Location: [place];  Service: General;  Laterality: N/A;    No Known Allergies  No family history on file.   Social History   Socioeconomic History  . Marital status: Divorced    Spouse name: Not on file  . Number of children: Not on file  . Years of education: Not on file  . Highest education level: Not on file  Occupational History  . Not on file  Tobacco Use  . Smoking status: Never Smoker  . Smokeless tobacco: Never Used  Substance and Sexual Activity  . Alcohol use: Yes    Alcohol/week: 4.0 standard drinks    Types: 4 Cans of beer per week    Comment: per day  . Drug use: No  . Sexual activity: Not on file  Other Topics Concern  . Not on file  Social History Narrative  . Not on file   Social Determinants of Health   Financial Resource Strain:   . Difficulty of Paying Living Expenses: Not on file  Food Insecurity:   . Worried  About Charity fundraiser in the Last Year: Not on file  . Ran Out of Food in the Last Year: Not on file  Transportation Needs:   . Lack of Transportation (Medical): Not on file  . Lack of Transportation (Non-Medical): Not on file  Physical Activity:   . Days of Exercise per Week: Not on file  . Minutes of Exercise per Session: Not on file  Stress:   . Feeling of Stress : Not on file  Social Connections:   . Frequency of Communication with Friends and Family: Not on file  . Frequency of Social Gatherings with Friends and Family: Not on file  . Attends Religious Services: Not on file  . Active Member of Clubs or Organizations: Not on file  . Attends Archivist Meetings: Not on file  . Marital Status: Not on file  Intimate Partner Violence:   . Fear of Current or Ex-Partner: Not on file  . Emotionally Abused: Not on file  . Physically Abused: Not on file  . Sexually Abused: Not on file     PHYSICAL EXAM:  VS: BP 130/86   Pulse 76   Ht 5\' 9"  (1.753 m)   Wt 235 lb (106.6 kg)   BMI 34.70  kg/m  Physical Exam Gen: NAD, alert, cooperative with exam, well-appearing ENT: normal lips, normal nasal mucosa,  Eye: normal EOM, normal conjunctiva and lids Skin: no rashes, no areas of induration  Neuro: normal tone, normal sensation to touch Psych:  normal insight, alert and oriented MSK:  Right foot: No significant swelling or ecchymosis. Tenderness to palpation over the second and fourth metatarsal. Normal ankle range of motion. Normal strength resistance. Neurovascularly intact  Limited ultrasound: Right foot:  Chronic changes of the first MTP joint. Old fracture appreciated at the distal second metatarsal shaft.  There appears to be a change near the base of the second metatarsal which would suggest more of an acute change such as a fracture.  There is increased hyperemia in this area. Chronic changes appreciated at the distal third metatarsal. There is increased  hyperemia at the base of the fourth metatarsal near the tarsometatarsal joint.  Summary: Some findings suggest acute change of the base of the second and fourth metatarsal which could represent a fracture.  Ultrasound and interpretation by Clearance Coots, MD    ASSESSMENT & PLAN:   Right foot pain Imaging from the sixth did not reveal an acute change.  Ultrasound was revealing for changes along the second metatarsal shaft predominantly.  There is also changes at the base of the fourth metatarsal.  These could represent fractures or sprain of the tarsometatarsal joints.  Injury occurred while at work. -Counseled on supportive care. -Would consider putting in a postop shoe or cam walker. -May need to consider MRI to evaluate for this if further imaging is negative.

## 2019-03-23 NOTE — Patient Instructions (Signed)
Nice to meet you Please try ice  Please try to elevate  Please send me a message in MyChart with any questions or updates.  We will wait to hear what your work has to say.   --Dr. Raeford Razor

## 2019-03-24 DIAGNOSIS — M79671 Pain in right foot: Secondary | ICD-10-CM | POA: Insufficient documentation

## 2019-03-24 NOTE — Assessment & Plan Note (Signed)
Imaging from the sixth did not reveal an acute change.  Ultrasound was revealing for changes along the second metatarsal shaft predominantly.  There is also changes at the base of the fourth metatarsal.  These could represent fractures or sprain of the tarsometatarsal joints.  Injury occurred while at work. -Counseled on supportive care. -Would consider putting in a postop shoe or cam walker. -May need to consider MRI to evaluate for this if further imaging is negative.

## 2021-05-21 ENCOUNTER — Other Ambulatory Visit: Payer: Self-pay | Admitting: Family Medicine

## 2021-05-21 DIAGNOSIS — E7800 Pure hypercholesterolemia, unspecified: Secondary | ICD-10-CM

## 2021-06-18 ENCOUNTER — Ambulatory Visit
Admission: RE | Admit: 2021-06-18 | Discharge: 2021-06-18 | Disposition: A | Discharge status: Home / Self Care | Payer: No Typology Code available for payment source | Source: Ambulatory Visit | Attending: Family Medicine | Admitting: Family Medicine

## 2021-06-18 DIAGNOSIS — E7800 Pure hypercholesterolemia, unspecified: Secondary | ICD-10-CM

## 2022-06-04 DIAGNOSIS — J0100 Acute maxillary sinusitis, unspecified: Secondary | ICD-10-CM | POA: Diagnosis not present

## 2022-06-11 ENCOUNTER — Encounter: Payer: Self-pay | Admitting: *Deleted

## 2022-06-17 DIAGNOSIS — H5213 Myopia, bilateral: Secondary | ICD-10-CM | POA: Diagnosis not present

## 2022-06-17 DIAGNOSIS — H524 Presbyopia: Secondary | ICD-10-CM | POA: Diagnosis not present

## 2022-06-17 DIAGNOSIS — H2513 Age-related nuclear cataract, bilateral: Secondary | ICD-10-CM | POA: Diagnosis not present

## 2022-06-17 DIAGNOSIS — H35033 Hypertensive retinopathy, bilateral: Secondary | ICD-10-CM | POA: Diagnosis not present

## 2022-06-17 DIAGNOSIS — H40013 Open angle with borderline findings, low risk, bilateral: Secondary | ICD-10-CM | POA: Diagnosis not present

## 2022-06-17 DIAGNOSIS — H52223 Regular astigmatism, bilateral: Secondary | ICD-10-CM | POA: Diagnosis not present

## 2022-11-17 DIAGNOSIS — Z0000 Encounter for general adult medical examination without abnormal findings: Secondary | ICD-10-CM | POA: Diagnosis not present

## 2022-11-17 DIAGNOSIS — Z6837 Body mass index (BMI) 37.0-37.9, adult: Secondary | ICD-10-CM | POA: Diagnosis not present

## 2022-11-17 DIAGNOSIS — Z125 Encounter for screening for malignant neoplasm of prostate: Secondary | ICD-10-CM | POA: Diagnosis not present

## 2022-11-17 DIAGNOSIS — I10 Essential (primary) hypertension: Secondary | ICD-10-CM | POA: Diagnosis not present

## 2022-11-17 DIAGNOSIS — A6000 Herpesviral infection of urogenital system, unspecified: Secondary | ICD-10-CM | POA: Diagnosis not present

## 2022-11-17 DIAGNOSIS — E538 Deficiency of other specified B group vitamins: Secondary | ICD-10-CM | POA: Diagnosis not present

## 2022-11-17 DIAGNOSIS — E7800 Pure hypercholesterolemia, unspecified: Secondary | ICD-10-CM | POA: Diagnosis not present

## 2022-11-17 DIAGNOSIS — D649 Anemia, unspecified: Secondary | ICD-10-CM | POA: Diagnosis not present

## 2022-12-22 DIAGNOSIS — D649 Anemia, unspecified: Secondary | ICD-10-CM | POA: Diagnosis not present

## 2023-01-05 DIAGNOSIS — K644 Residual hemorrhoidal skin tags: Secondary | ICD-10-CM | POA: Diagnosis not present

## 2023-01-05 DIAGNOSIS — K625 Hemorrhage of anus and rectum: Secondary | ICD-10-CM | POA: Diagnosis not present

## 2023-01-05 DIAGNOSIS — K641 Second degree hemorrhoids: Secondary | ICD-10-CM | POA: Diagnosis not present

## 2023-01-05 DIAGNOSIS — K642 Third degree hemorrhoids: Secondary | ICD-10-CM | POA: Diagnosis not present

## 2023-01-19 DIAGNOSIS — H2513 Age-related nuclear cataract, bilateral: Secondary | ICD-10-CM | POA: Diagnosis not present

## 2023-01-19 DIAGNOSIS — H40013 Open angle with borderline findings, low risk, bilateral: Secondary | ICD-10-CM | POA: Diagnosis not present

## 2023-01-19 DIAGNOSIS — H35033 Hypertensive retinopathy, bilateral: Secondary | ICD-10-CM | POA: Diagnosis not present

## 2023-03-19 ENCOUNTER — Ambulatory Visit: Payer: Self-pay | Admitting: Surgery

## 2023-04-21 NOTE — Patient Instructions (Signed)
 SURGICAL WAITING ROOM VISITATION  Patients having surgery or a procedure may have no more than 2 support people in the waiting area - these visitors may rotate.    Children under the age of 5 must have an adult with them who is not the patient.  Due to an increase in RSV and influenza rates and associated hospitalizations, children ages 51 and under may not visit patients in [facility] hospitals.  Visitors with respiratory illnesses are discouraged from visiting and should remain at home.  If the patient needs to stay at the hospital during part of their recovery, the visitor guidelines for inpatient rooms apply. Pre-op nurse will coordinate an appropriate time for 1 support person to accompany patient in pre-op.  This support person may not rotate.    Please refer to the [facility] website for the visitor guidelines for Inpatients (after your surgery is over and you are in a regular room).       Your procedure is scheduled on: 05/07/23   Report to [facility] Main Entrance    Report to admitting at 5:15  AM   Call this number if you have problems the morning of surgery [phone]   Do not eat food :After Midnight.   After Midnight you may have the following liquids until 4:30 AM DAY OF SURGERY  Water Non-Citrus Juices (without pulp, NO RED-Apple, White grape, White cranberry) Black Coffee (NO MILK/CREAM OR CREAMERS, sugar ok)  Clear Tea (NO MILK/CREAM OR CREAMERS, sugar ok) regular and decaf                             Plain Jell-O (NO RED)                                           Fruit ices (not with fruit pulp, NO RED)                                     Popsicles (NO RED)                                                               Sports drinks like Gatorade (NO RED)                   FOLLOW BOWEL PREP AND ANY ADDITIONAL PRE OP INSTRUCTIONS YOU RECEIVED FROM YOUR SURGEON'S OFFICE!!!     Oral Hygiene is also important to reduce your risk of infection.                                     Remember - BRUSH YOUR TEETH THE MORNING OF SURGERY WITH YOUR REGULAR TOOTHPASTE  DENTURES WILL BE REMOVED PRIOR TO SURGERY PLEASE DO NOT APPLY "Poly grip" OR ADHESIVES!!!   Do NOT smoke after Midnight   Stop all vitamins and herbal supplements 7 days before surgery.   Take these medicines the morning of surgery with A SIP OF WATER: Pantoprazole(protonix), Rosuvastatin(crestor)  You may not have any metal on your body including hair pins, jewelry, and body piercing             Do not wear make-up, lotions, powders, perfumes/cologne, or deodorant ours prior to surgery.               Men may shave face and neck.   Do not bring valuables to the hospital. [facility] IS NOT             RESPONSIBLE   FOR VALUABLES.   Contacts, glasses, dentures or bridgework may not be worn into surgery.  DO NOT BRING YOUR HOME MEDICATIONS TO THE HOSPITAL. PHARMACY WILL DISPENSE MEDICATIONS LISTED ON YOUR MEDICATION LIST TO YOU DURING YOUR ADMISSION IN THE HOSPITAL!    Patients discharged on the day of surgery will not be allowed to drive home.  Someone NEEDS to stay with you for the first 24 hours after anesthesia.   Special Instructions: Bring a copy of your healthcare power of attorney and living will documents the day of surgery if you haven't scanned them before.              Please read over the following fact sheets you were given: IF YOU HAVE QUESTIONS ABOUT YOUR PRE-OP INSTRUCTIONS PLEASE CALL [phone] Rosey [facility]   If you received a COVID test during your pre-op visit  it is requested that you wear a mask when out in public, stay away from anyone that may not be feeling well and notify your surgeon if you develop symptoms. If you test positive for Covid or have been in contact with anyone that has tested positive in the last 10 days please notify you surgeon.    [facility] - Preparing for Surgery Before surgery, you can play an important role.  Because  skin is not sterile, your skin needs to be as free of germs as possible.  You can reduce the number of germs on your skin by washing with CHG (chlorahexidine gluconate) soap before surgery.  CHG is an antiseptic cleaner which kills germs and bonds with the skin to continue killing germs even after washing. Please DO NOT use if you have an allergy to CHG or antibacterial soaps.  If your skin becomes reddened/irritated stop using the CHG and inform your nurse when you arrive at Short Stay. Do not shave (including legs and underarms) for at least 48 hours prior to the first CHG shower.  You may shave your face/neck.  Please follow these instructions carefully:  1.  Shower with CHG Soap the night before surgery and the  morning of surgery.  2.  If you choose to wash your hair, wash your hair first as usual with your normal  shampoo.  3.  After you shampoo, rinse your hair and body thoroughly to remove the shampoo.                             4.  Use CHG as you would any other liquid soap.  You can apply chg directly to the skin and wash.  Gently with a scrungie or clean washcloth.  5.  Apply the CHG Soap to your body ONLY FROM THE NECK DOWN.   Do   not use on face/ open                           Wound or open sores.  Avoid contact with eyes, ears mouth and   genitals (private parts).                       Wash face,  Genitals (private parts) with your normal soap.             6.  Wash thoroughly, paying special attention to the area where your    surgery  will be performed.  7.  Thoroughly rinse your body with warm water from the neck down.  8.  DO NOT shower/wash with your normal soap after using and rinsing off the CHG Soap.                9.  Pat yourself dry with a clean towel.            10.  Wear clean pajamas.            11.  Place clean sheets on your bed the night of your first shower and do not  sleep with pets. Day of Surgery : Do not apply any lotions/deodorants the morning of surgery.   Please wear clean clothes to the hospital/surgery center.  FAILURE TO FOLLOW THESE INSTRUCTIONS MAY RESULT IN THE CANCELLATION OF YOUR SURGERY  PATIENT SIGNATURE_________________________________  NURSE SIGNATURE__________________________________  ________________________________________________________________________

## 2023-04-21 NOTE — Progress Notes (Signed)
 COVID Vaccine received:  []  No [x]  Yes Date of any COVID positive Test in last 90 days: no PCP - Sigmund Hazel MD Cardiologist - n/a  Chest x-ray -  EKG -  04/23/23 Epic Stress Test -  ECHO -  Cardiac Cath -   Bowel Prep - [x]  No  []   Yes ______  Pacemaker / ICD device [x]  No []  Yes   Spinal Cord Stimulator:[x]  No []  Yes       History of Sleep Apnea? [x]  No []  Yes   CPAP used?- [x]  No []  Yes    Does the patient monitor blood sugar?          [x]  No []  Yes  []  N/A  Patient has: [x]  NO Hx DM   []  Pre-DM                 []  DM1  []   DM2 Does patient have a [employer] or Dexacom? []  No []  Yes   Fasting Blood Sugar Ranges-  Checks Blood Sugar _____ times a day  GLP1 agonist / usual dose - no GLP1 instructions:  SGLT-2 inhibitors / usual dose - no SGLT-2 instructions:   Blood Thinner / Instructions:no Aspirin Instructions:no  Comments:   Activity level: Patient is able to climb a flight of stairs without difficulty; [x]  No CP  [x]  No SOB__   Patient can perform ADLs without assistance.   Anesthesia review:   Patient denies shortness of breath, fever, cough and chest pain at PAT appointment.  Patient verbalized understanding and agreement to the Pre-Surgical Instructions that were given to them at this PAT appointment. Patient was also educated of the need to review these PAT instructions again prior to his/her surgery.I reviewed the appropriate phone numbers to call if they have any and questions or concerns.

## 2023-04-23 ENCOUNTER — Other Ambulatory Visit: Payer: Self-pay

## 2023-04-23 ENCOUNTER — Encounter (HOSPITAL_COMMUNITY): Payer: Self-pay

## 2023-04-23 ENCOUNTER — Encounter (HOSPITAL_COMMUNITY)
Admission: RE | Admit: 2023-04-23 | Discharge: 2023-04-23 | Disposition: A | Discharge status: Home / Self Care | Payer: Self-pay | Source: Ambulatory Visit | Attending: Surgery | Admitting: Surgery

## 2023-04-23 VITALS — BP 133/87 | HR 74 | Temp 98.3°F | Resp 16 | Ht 68.5 in | Wt 231.0 lb

## 2023-04-23 DIAGNOSIS — Z01812 Encounter for preprocedural laboratory examination: Secondary | ICD-10-CM | POA: Diagnosis not present

## 2023-04-23 DIAGNOSIS — I10 Essential (primary) hypertension: Secondary | ICD-10-CM | POA: Insufficient documentation

## 2023-04-23 DIAGNOSIS — Z01818 Encounter for other preprocedural examination: Secondary | ICD-10-CM | POA: Diagnosis not present

## 2023-04-23 DIAGNOSIS — Z01810 Encounter for preprocedural cardiovascular examination: Secondary | ICD-10-CM | POA: Diagnosis not present

## 2023-04-23 HISTORY — DX: Unspecified osteoarthritis, unspecified site: M19.90

## 2023-04-23 LAB — BASIC METABOLIC PANEL
Anion gap: 10 (ref 5–15)
BUN: 14 mg/dL (ref 6–20)
CO2: 21 mmol/L — ABNORMAL LOW (ref 22–32)
Calcium: 9.8 mg/dL (ref 8.9–10.3)
Chloride: 107 mmol/L (ref 98–111)
Creatinine, Ser: 1.14 mg/dL (ref 0.61–1.24)
GFR, Estimated: >60 mL/min (ref >60)
Glucose, Bld: 96 mg/dL (ref 70–99)
Potassium: 4.2 mmol/L (ref 3.5–5.1)
Sodium: 138 mmol/L (ref 135–145)

## 2023-04-23 LAB — CBC
HCT: 34.3 % — ABNORMAL LOW (ref 39.0–52.0)
Hemoglobin: 10.4 g/dL — ABNORMAL LOW (ref 13.0–17.0)
MCH: 26.5 pg (ref 26.0–34.0)
MCHC: 30.3 g/dL (ref 30.0–36.0)
MCV: 87.5 fL (ref 80.0–100.0)
Platelets: 205 10*3/uL (ref 150–400)
RBC: 3.92 MIL/uL — ABNORMAL LOW (ref 4.22–5.81)
RDW: 14.7 % (ref 11.5–15.5)
WBC: 5.5 10*3/uL (ref 4.0–10.5)
nRBC: 0 % (ref 0.0–0.2)

## 2023-05-07 ENCOUNTER — Encounter (HOSPITAL_COMMUNITY): Payer: Self-pay | Admitting: Surgery

## 2023-05-07 ENCOUNTER — Ambulatory Visit (HOSPITAL_COMMUNITY): Admitting: Anesthesiology

## 2023-05-07 ENCOUNTER — Ambulatory Visit (HOSPITAL_COMMUNITY)
Admission: RE | Admit: 2023-05-07 | Discharge: 2023-05-07 | Disposition: A | Discharge status: Home / Self Care | Payer: BC Managed Care – PPO | Attending: Surgery | Admitting: Surgery

## 2023-05-07 ENCOUNTER — Other Ambulatory Visit: Payer: Self-pay

## 2023-05-07 ENCOUNTER — Encounter (HOSPITAL_COMMUNITY)
Admission: RE | Disposition: A | Discharge status: Home / Self Care | Payer: Self-pay | Source: Home / Self Care | Attending: Surgery

## 2023-05-07 DIAGNOSIS — K2980 Duodenitis without bleeding: Secondary | ICD-10-CM | POA: Diagnosis not present

## 2023-05-07 DIAGNOSIS — I10 Essential (primary) hypertension: Secondary | ICD-10-CM | POA: Diagnosis not present

## 2023-05-07 DIAGNOSIS — K644 Residual hemorrhoidal skin tags: Secondary | ICD-10-CM | POA: Diagnosis not present

## 2023-05-07 DIAGNOSIS — K642 Third degree hemorrhoids: Secondary | ICD-10-CM | POA: Diagnosis not present

## 2023-05-07 DIAGNOSIS — M1990 Unspecified osteoarthritis, unspecified site: Secondary | ICD-10-CM | POA: Insufficient documentation

## 2023-05-07 DIAGNOSIS — K643 Fourth degree hemorrhoids: Secondary | ICD-10-CM | POA: Insufficient documentation

## 2023-05-07 DIAGNOSIS — J45909 Unspecified asthma, uncomplicated: Secondary | ICD-10-CM | POA: Diagnosis not present

## 2023-05-07 HISTORY — PX: EVALUATION UNDER ANESTHESIA WITH HEMORRHOIDECTOMY: SHX5624

## 2023-05-07 SURGERY — EXAM UNDER ANESTHESIA WITH HEMORRHOIDECTOMY
Anesthesia: General

## 2023-05-07 MED ORDER — ONDANSETRON HCL 4 MG/2ML IJ SOLN
INTRAMUSCULAR | Status: AC
  Filled 2023-05-07: qty 2

## 2023-05-07 MED ORDER — OXYCODONE HCL 5 MG PO TABS: 5.0000 mg | ORAL_TABLET | Freq: Four times a day (QID) | ORAL | 0 refills | Status: AC | PRN

## 2023-05-07 MED ORDER — DIBUCAINE (PERIANAL) 1 % EX OINT
TOPICAL_OINTMENT | CUTANEOUS | Status: DC | PRN
  Administered 2023-05-07: 1 via RECTAL

## 2023-05-07 MED ORDER — DIAZEPAM 5 MG PO TABS: 5.0000 mg | ORAL_TABLET | Freq: Three times a day (TID) | ORAL | 1 refills | Status: AC | PRN

## 2023-05-07 MED ORDER — METHYLENE BLUE (ANTIDOTE) 1 % IV SOLN
INTRAVENOUS | Status: AC
Start: 2023-05-07 — End: ?
  Filled 2023-05-07: qty 10

## 2023-05-07 MED ORDER — PROPOFOL 10 MG/ML IV BOLUS
INTRAVENOUS | Status: DC | PRN
  Administered 2023-05-07: 200 mg via INTRAVENOUS

## 2023-05-07 MED ORDER — CHLORHEXIDINE GLUCONATE 0.12 % MT SOLN
15.0000 mL | Freq: Once | OROMUCOSAL | Status: AC
  Administered 2023-05-07: 15 mL via OROMUCOSAL

## 2023-05-07 MED ORDER — OXYCODONE HCL 5 MG PO TABS
ORAL_TABLET | ORAL | Status: AC
  Filled 2023-05-07: qty 1

## 2023-05-07 MED ORDER — ORAL CARE MOUTH RINSE: 15.0000 mL | Freq: Once | OROMUCOSAL | Status: AC

## 2023-05-07 MED ORDER — PROPOFOL 10 MG/ML IV BOLUS
INTRAVENOUS | Status: AC
  Filled 2023-05-07: qty 20

## 2023-05-07 MED ORDER — GABAPENTIN 300 MG PO CAPS
300.0000 mg | ORAL_CAPSULE | ORAL | Status: AC
  Administered 2023-05-07: 300 mg via ORAL
  Filled 2023-05-07: qty 1

## 2023-05-07 MED ORDER — LACTATED RINGERS IV SOLN: INTRAVENOUS | Status: DC

## 2023-05-07 MED ORDER — MIDAZOLAM HCL 2 MG/2ML IJ SOLN
INTRAMUSCULAR | Status: AC
  Filled 2023-05-07: qty 2

## 2023-05-07 MED ORDER — FENTANYL CITRATE (PF) 100 MCG/2ML IJ SOLN
INTRAMUSCULAR | Status: AC
  Filled 2023-05-07: qty 2

## 2023-05-07 MED ORDER — BUPIVACAINE LIPOSOME 1.3 % IJ SUSP
INTRAMUSCULAR | Status: DC | PRN
  Administered 2023-05-07: 20 mL

## 2023-05-07 MED ORDER — BUPIVACAINE LIPOSOME 1.3 % IJ SUSP: 20.0000 mL | Freq: Once | INTRAMUSCULAR | Status: DC

## 2023-05-07 MED ORDER — ONDANSETRON HCL 4 MG/2ML IJ SOLN: 4.0000 mg | Freq: Four times a day (QID) | INTRAMUSCULAR | Status: DC | PRN

## 2023-05-07 MED ORDER — CHLORHEXIDINE GLUCONATE CLOTH 2 % EX PADS: 6.0000 | MEDICATED_PAD | Freq: Once | CUTANEOUS | Status: DC

## 2023-05-07 MED ORDER — MIDAZOLAM HCL 5 MG/5ML IJ SOLN
INTRAMUSCULAR | Status: DC | PRN
  Administered 2023-05-07: 2 mg via INTRAVENOUS

## 2023-05-07 MED ORDER — BUPIVACAINE-EPINEPHRINE (PF) 0.25% -1:200000 IJ SOLN
INTRAMUSCULAR | Status: AC
  Filled 2023-05-07: qty 30

## 2023-05-07 MED ORDER — 0.9 % SODIUM CHLORIDE (POUR BTL) OPTIME
TOPICAL | Status: DC | PRN
  Administered 2023-05-07: 1000 mL

## 2023-05-07 MED ORDER — SUGAMMADEX SODIUM 200 MG/2ML IV SOLN
INTRAVENOUS | Status: DC | PRN
  Administered 2023-05-07: 200 mg via INTRAVENOUS

## 2023-05-07 MED ORDER — ACETAMINOPHEN 500 MG PO TABS
1000.0000 mg | ORAL_TABLET | ORAL | Status: AC
  Administered 2023-05-07: 1000 mg via ORAL
  Filled 2023-05-07: qty 2

## 2023-05-07 MED ORDER — DEXAMETHASONE SODIUM PHOSPHATE 10 MG/ML IJ SOLN
INTRAMUSCULAR | Status: AC
  Filled 2023-05-07: qty 1

## 2023-05-07 MED ORDER — OXYCODONE HCL 5 MG/5ML PO SOLN: 5.0000 mg | Freq: Once | ORAL | Status: AC | PRN

## 2023-05-07 MED ORDER — LIDOCAINE HCL (CARDIAC) PF 100 MG/5ML IV SOSY
PREFILLED_SYRINGE | INTRAVENOUS | Status: DC | PRN
  Administered 2023-05-07: 80 mg via INTRAVENOUS

## 2023-05-07 MED ORDER — DIBUCAINE (PERIANAL) 1 % EX OINT
TOPICAL_OINTMENT | CUTANEOUS | Status: AC
  Filled 2023-05-07: qty 28

## 2023-05-07 MED ORDER — ROCURONIUM BROMIDE 100 MG/10ML IV SOLN
INTRAVENOUS | Status: DC | PRN
  Administered 2023-05-07: 50 mg via INTRAVENOUS

## 2023-05-07 MED ORDER — DEXAMETHASONE SODIUM PHOSPHATE 10 MG/ML IJ SOLN
INTRAMUSCULAR | Status: DC | PRN
Start: 2023-05-07 — End: 2023-05-07
  Administered 2023-05-07: 5 mg via INTRAVENOUS

## 2023-05-07 MED ORDER — OXYCODONE HCL 5 MG PO TABS
5.0000 mg | ORAL_TABLET | Freq: Once | ORAL | Status: AC | PRN
  Administered 2023-05-07: 5 mg via ORAL

## 2023-05-07 MED ORDER — DEXMEDETOMIDINE HCL IN NACL 80 MCG/20ML IV SOLN
INTRAVENOUS | Status: DC | PRN
Start: 2023-05-07 — End: 2023-05-07
  Administered 2023-05-07: 8 ug via INTRAVENOUS
  Administered 2023-05-07 (×3): 4 ug via INTRAVENOUS

## 2023-05-07 MED ORDER — CELECOXIB 200 MG PO CAPS
200.0000 mg | ORAL_CAPSULE | ORAL | Status: AC
  Administered 2023-05-07: 200 mg via ORAL
  Filled 2023-05-07: qty 1

## 2023-05-07 MED ORDER — FENTANYL CITRATE PF 50 MCG/ML IJ SOSY: 25.0000 ug | PREFILLED_SYRINGE | INTRAMUSCULAR | Status: DC | PRN

## 2023-05-07 MED ORDER — FENTANYL CITRATE (PF) 100 MCG/2ML IJ SOLN
INTRAMUSCULAR | Status: DC | PRN
  Administered 2023-05-07 (×2): 50 ug via INTRAVENOUS
  Administered 2023-05-07: 100 ug via INTRAVENOUS

## 2023-05-07 MED ORDER — BUPIVACAINE-EPINEPHRINE (PF) 0.5% -1:200000 IJ SOLN
INTRAMUSCULAR | Status: AC
  Filled 2023-05-07: qty 30

## 2023-05-07 MED ORDER — BUPIVACAINE-EPINEPHRINE 0.5% -1:200000 IJ SOLN
INTRAMUSCULAR | Status: DC | PRN
  Administered 2023-05-07: 30 mL

## 2023-05-07 MED ORDER — LACTATED RINGERS IV SOLN: INTRAVENOUS | Status: DC | PRN

## 2023-05-07 MED ORDER — ONDANSETRON HCL 4 MG/2ML IJ SOLN
INTRAMUSCULAR | Status: DC | PRN
  Administered 2023-05-07: 4 mg via INTRAVENOUS

## 2023-05-07 MED ORDER — BUPIVACAINE LIPOSOME 1.3 % IJ SUSP
INTRAMUSCULAR | Status: AC
  Filled 2023-05-07: qty 20

## 2023-05-07 SURGICAL SUPPLY — 30 items
BAG COUNTER SPONGE SURGICOUNT (BAG) IMPLANT
BENZOIN TINCTURE PRP APPL 2/3 (GAUZE/BANDAGES/DRESSINGS) ×1 IMPLANT
BLADE SURG 15 STRL LF DISP TIS (BLADE) IMPLANT
BRIEF MESH DISP LRG (UNDERPADS AND DIAPERS) ×1 IMPLANT
CNTNR URN SCR LID CUP LEK RST (MISCELLANEOUS) ×1 IMPLANT
COVER SURGICAL LIGHT HANDLE (MISCELLANEOUS) ×1 IMPLANT
DRAPE LAPAROTOMY T 102X78X121 (DRAPES) ×1 IMPLANT
ELECT REM PT RETURN 15FT ADLT (MISCELLANEOUS) ×1 IMPLANT
GAUZE 4X4 16PLY ~~LOC~~+RFID DBL (SPONGE) ×1 IMPLANT
GAUZE PAD ABD 8X10 STRL (GAUZE/BANDAGES/DRESSINGS) IMPLANT
GAUZE SPONGE 4X4 12PLY STRL (GAUZE/BANDAGES/DRESSINGS) IMPLANT
GLOVE ECLIPSE 8.0 STRL XLNG CF (GLOVE) ×1 IMPLANT
GLOVE INDICATOR 8.0 STRL GRN (GLOVE) ×1 IMPLANT
GOWN STRL REUS W/ TWL XL LVL3 (GOWN DISPOSABLE) ×3 IMPLANT
KIT BASIN OR (CUSTOM PROCEDURE TRAY) ×1 IMPLANT
KIT TURNOVER KIT A (KITS) IMPLANT
LOOP VESSEL MAXI BLUE (MISCELLANEOUS) IMPLANT
NDL HYPO 22X1.5 SAFETY MO (MISCELLANEOUS) ×1 IMPLANT
NEEDLE HYPO 22X1.5 SAFETY MO (MISCELLANEOUS) ×1 IMPLANT
PACK BASIC VI WITH GOWN DISP (CUSTOM PROCEDURE TRAY) ×1 IMPLANT
PENCIL BUTTON HOLSTER BLD 10FT (ELECTRODE) ×1 IMPLANT
SCRUB CHG 4% DYNA-HEX 4OZ (MISCELLANEOUS) ×1 IMPLANT
SPIKE FLUID TRANSFER (MISCELLANEOUS) ×1 IMPLANT
SURGILUBE 2OZ TUBE FLIPTOP (MISCELLANEOUS) ×1 IMPLANT
SUT CHROMIC 2 0 SH (SUTURE) ×1 IMPLANT
SUT VIC AB 2-0 UR6 27 (SUTURE) ×6 IMPLANT
SYR 20ML LL LF (SYRINGE) ×1 IMPLANT
SYR 3ML LL SCALE MARK (SYRINGE) IMPLANT
TOWEL OR 17X26 10 PK STRL BLUE (TOWEL DISPOSABLE) ×1 IMPLANT
YANKAUER SUCT BULB TIP 10FT TU (MISCELLANEOUS) ×1 IMPLANT

## 2023-05-07 NOTE — Transfer of Care (Signed)
 Immediate Anesthesia Transfer of Care Note  Patient: Gregory Porter  Procedure(s) Performed: ANORECTAL EXAM UNDER ANESTHESIA WITH HEMORRHOIDECTOMY WITH LIGATION AND HEMORRHOIDOPEXY,  Patient Location: PACU  Anesthesia Type:General  Level of Consciousness: awake and alert   Airway & Oxygen Therapy: Patient Spontanous Breathing and Patient connected to nasal cannula oxygen  Post-op Assessment: Report given to RN and Post -op Vital signs reviewed and stable  Post vital signs: Reviewed and stable  Last Vitals:  Vitals Value Taken Time  BP 104/58 05/07/23 0910  Temp    Pulse 66 05/07/23 0912  Resp 19 05/07/23 0912  SpO2 98 % 05/07/23 0912  Vitals shown include unfiled device data.  Last Pain:  Vitals:   05/07/23 0657  TempSrc:   PainSc: 0-No pain      Patients Stated Pain Goal: 5 (05/07/23 0657)  Complications: No notable events documented.

## 2023-05-07 NOTE — H&P (Signed)
 05/07/2023    REFERRING PHYSICIAN: Sigmund Hazel  Patient Care Team: [facility] as PCP - General Evette Cristal, Wende Bushy, MD (Gastroenterology) Michaell Cowing, Shawn Route, MD as Consulting Provider (General Surgery)  PROVIDER: Jarrett Soho, MD  [facility] MRN: [id number] DOB: 1965/07/26  SUBJECTIVE   Chief Complaint: New Consultation (Hemorrhoids)   Gregory Porter is a 58 y.o. male  who is seen today as an office consultation  at the request of DrHyacinth Meeker  for evaluation of hemorrhoids   History of Present Illness:  Patient followed by [facility] gastroenterology in the past. He has had colonoscopies that were normal in 2015 and 2017. Had Doximity in June 2017 Showed Some Distal Duodenal Erosions and Proximal Jejunal Erosions. Nothing Major. Was Admitted in 2018 with Obstructive like Symptoms Assumed to Be NonInflammatory Enteritis. He is on a PPI.  He claims he moves his bowels once a day. Occasionally he will get some bleeding and itching and irritation. Seems like the external hemorrhoids are what bother him the most. No unbearable pain but occasionally bothersome. Has to get up to urinate a couple times a night. Does not have a urologist. No difficulty starting or maintaining stream. He has never had any anorectal interventions. No history of Crohn's or inflammatory bowel disease that he is aware of. Claims he can walk at least 20 minutes of difficulty. He is never really had any abdominal surgery. No cardiac or pulmonary issues.  He had a friend at work that got the hemorrhoids band and told him that it would be easy to take care of. He was hoping it would be that simple.  Medical History:  Past Medical History:  Diagnosis Date  GERD (gastroesophageal reflux disease)  Hyperlipidemia  Hypertension   Patient Active Problem List  Diagnosis  Prolapsed internal hemorrhoids, grade 3  Prolapsed internal hemorrhoids, grade 2  External hemorrhoid, bleeding   Duodenitis  Bright red rectal bleeding   Past Surgical History:  Procedure Laterality Date  BACK SURERY  WRIST/HAND SURGERY    No Known Allergies  Current Outpatient Medications on File Prior to Visit  Medication Sig Dispense Refill  olmesartan (BENICAR) 40 MG tablet Take 40 mg by mouth once daily  pantoprazole (PROTONIX) 40 MG DR tablet Take 40 mg by mouth once daily  rosuvastatin (CRESTOR) 10 MG tablet Take 10 mg by mouth once daily   No current facility-administered medications on file prior to visit.   History reviewed. No pertinent family history.   Social History   Tobacco Use  Smoking Status Every Day  Types: Cigarettes  Smokeless Tobacco Not on file    Social History   Socioeconomic History  Marital status: Divorced  Tobacco Use  Smoking status: Every Day  Types: Cigarettes  Vaping Use  Vaping status: Unknown  Substance and Sexual Activity  Alcohol use: Yes  Drug use: Never   Social Drivers of Health   Food Insecurity: Low Risk (06/04/2022)  Received from [facility]  Hunger Vital Sign  Worried About Running Out of Food in the Last Year: Never true  Ran Out of Food in the Last Year: Never true  Housing Stability: Low Risk (06/04/2022)  Received from [employer] Stability Vital Sign  What is your living situation today?: I have a steady place to live  Think about the place you live. Do you have problems with any of the following? Choose all that apply:: None/None on this list   ############################################################  Review of Systems: A complete  review of systems (ROS) was obtained from the patient.  We have reviewed this information and discussed as appropriate with the patient.  See HPI as well for other pertinent ROS.  Constitutional: No fevers, chills, sweats. Weight stable Eyes: No vision changes, No discharge HENT: No sore throats, nasal drainage Lymph: No neck swelling, No bruising easily Pulmonary: No  cough, productive sputum CV: No orthopnea, PND . No exertional chest/neck/shoulder/arm pain. Patient can walk 20 minutes without difficulty.   GI: No personal nor family history of GI/colon cancer, inflammatory bowel disease, irritable bowel syndrome, allergy such as Celiac Sprue, dietary/dairy problems, colitis, ulcers nor gastritis. No recent sick contacts/gastroenteritis. No travel outside the country. No changes in diet.  Renal: No UTIs, No hematuria Genital: No drainage, bleeding, masses Musculoskeletal: No severe joint pain. Good ROM major joints Skin: No sores or lesions Heme/Lymph: No easy bleeding. No swollen lymph nodes Neuro: No active seizures. No facial droop Psych: No hallucinations. No agitation  OBJECTIVE   Vitals:  01/05/23 1611  BP: 125/77  Pulse: 76  Temp: 36.5 C (97.7 F)  SpO2: 97%  Weight: (!) 109 kg (240 lb 6.4 oz)  Height: 172.7 cm (5\' 8" )  PainSc: 0-No pain   Body mass index is 36.55 kg/m.  PHYSICAL EXAM:  Constitutional: Not cachectic. Hygeine adequate. Vitals signs as above.  Eyes: No glasses. Vision adequate,Pupils reactive, normal extraocular movements. Sclera nonicteric Neuro: CN II-XII intact. No major focal sensory defects. No major motor deficits. Lymph: No head/neck/groin lymphadenopathy Psych: No severe agitation. No severe anxiety. Judgment & insight Adequate, Oriented x4, HENT: Normocephalic, Mucus membranes moist. No thrush. Hearing: adequate Neck: Supple, No tracheal deviation. No obvious thyromegaly Chest: No pain to chest wall compression. Good respiratory excursion. No audible wheezing CV: Pulses intact. regular. No major extremity edema Ext: No obvious deformity or contracture. Edema: Not present. No cyanosis Skin: No major subcutaneous nodules. Warm and dry Musculoskeletal: Severe joint rigidity not present. No obvious clubbing. No digital petechiae. Mobility: no assist device moving easily without restrictions  Abdomen: Obese  Soft. Nondistended. Nontender. Hernia: Not present. Diastasis recti: Mild supraumbilical midline. No hepatomegaly. No splenomegaly.  Genital/Pelvic: Inguinal hernia: Not present. Inguinal lymph nodes: without lymphadenopathy nor hidradenitis.   Rectal:  Perianal skin Clean with good hygiene  Pruritis ani: Mild Pilonidal disease: Not present Condyloma / warts: Not present  Anal fissure: Not present Perirectal abscess/fistula Not present External hemorrhoids left lateral greater than right anterior external hemorrhoidal swelling.  Digital and anoscopic rectal exam Barely tolerated  Sphincter tone Increased  Hemorrhoidal piles right posterior angry swollen friable grade 2 probable 3 hemorrhoid. Right anterior larger and prolapses. Left lateral with external component as well. Prostate: Enlarged but smooth Rectovaginal septum: N/A Rectal masses: Not present  Other significant findings: N/A  Patient examined with patient in decubitus position .  ###################################    ###################################################################  Labs, Imaging and Diagnostic Testing:  Located in 'Care Everywhere' section of Epic EMR chart  PRIOR [facility] CLINIC NOTES:  Not applicable  SURGERY NOTES:  Not applicable  PATHOLOGY:  Not applicable  Assessment and Plan:  DIAGNOSES:  Diagnoses and all orders for this visit:  External hemorrhoid, bleeding  Prolapsed internal hemorrhoids, grade 3  Prolapsed internal hemorrhoids, grade 2  Bright red rectal bleeding  Duodenitis    ASSESSMENT/PLAN  Irritated internal as well as prolapsing and external hemorrhoids. I think these are to sensitive enlarged to safely band. His main complaint seems to be the external hemorrhoids that are hard to  keep clean and somewhat annoying. He has at least external and grade 3 piles which make banding unlikely to be tolerated or be effective.  Therefore I recommend outpatient  hemorrhoidal ligation & pexy and hemorrhoidectomy to remove and get the anatomy better.   The anatomy & physiology of the anorectal region was discussed. The pathophysiology of hemorrhoids and differential diagnosis was discussed. Natural history risks without surgery was discussed. I stressed the importance of a bowel regimen to have daily soft bowel movements to minimize progression of disease. Interventions such as sclerotherapy & banding were discussed.  The patient's symptoms are not adequately controlled by medicines and other non-operative treatments. I feel the risks & problems of no surgery outweigh the operative risks; therefore, I recommended surgery to treat the hemorrhoids by ligation, pexy, and possible resection.  Risks such as bleeding, infection, urinary difficulties, injury to other organs, need for repair of tissues / organs, need for further treatment, heart attack, death, and other risks were discussed. I noted a good likelihood this will help address the problem. Goals of post-operative recovery were discussed as well. Possibility that this will not correct all symptoms was explained. Post-operative pain, bleeding, constipation, and other problems after surgery were discussed. We will work to minimize complications. Educational handouts further explaining the pathology, treatment options, and bowel regimen were given as well. Questions were answered.   He is now ready for surgery  Ardeth Sportsman, MD, FACS, MASCRS Esophageal, Gastrointestinal & Colorectal Surgery Robotic and Minimally Invasive Surgery  [facility] [address], [address] [place], [place] [address] [phone] Fax [phone] Main  CONTACT INFORMATION: Weekday (9AM-5PM): Call [facility] main office at [phone] Weeknight (5PM-9AM) or Weekend/Holiday: Check EPIC "Web Links" tab & use "AMION" (password " [email or web address]") for General Surgery [facility] coverage  Please,  DO NOT use SecureChat  (it is not reliable communication to reach operating surgeons & will lead to a delay in care).   Epic staff messaging available for outptient concerns needing 1-2 business day response.     05/07/2023

## 2023-05-07 NOTE — Discharge Instructions (Signed)
 ##############################################  ANORECTAL SURGERY:  POST OPERATIVE INSTRUCTIONS  ######################################################################  EAT Start with a pureed / full liquid diet After 24 hours, gradually transition to a high fiber diet.    CONTROL PAIN Control pain so you can tolerate bowel movements,  walk, sleep, tolerate sneezing/coughing, and go up/down stairs.   HAVE A BOWEL MOVEMENT DAILY Keep your bowels regular to avoid problems.   Taking a fiber supplement 2-4 doses every day to keep bowels soft.   HAVE A BM BY THE SECOND POSTOPERATIVE DAY Use an antidairrheal to slow down diarrhea.   Call if not better after 2 tries  WALK Walk an hour a day.  Control your pain to do that.   CALL IF YOU HAVE PROBLEMS/CONCERNS Call if you are still struggling despite following these instructions. Call if you have concerns not answered by these instructions  ######################################################################    Take your usually prescribed home medications unless otherwise directed. Blood thinners:  You can restart any strong blood thinners after the second postoperative day  for example: COUMADIN (warfarin), XERELTO (rivaroxaban), ELIQUIS (apixaban), PLAVIX (clopidigrel), BRILINTA (ticagrelor), EFFIENT (prasugrel), PRADAXA (dabigatran), etc  Continue aspirin before & after surgery..     Some oozing/bleeding the first 1-2 weeks is common but should taper down & be small volume.    If you are passing many large clots or having uncontrolling bleeding, call your surgeon  DIET: Follow a light bland diet & liquids the first 24 hours after arrival home, such as soup, liquids, starches, etc.  Be sure to drink plenty of fluids.  Quickly advance to a usual solid diet within a few days.  Avoid fast food or heavy meals as your are more likely to get nauseated or have irregular bowels.  A low-fat, high-fiber diet for the rest of your life  is ideal.  Control pain to avoid problems with urinating or defecating:  Pain is best controlled by a usual combination of many methods TOGETHER: Warm baths/soaks or Ice packs Over the counter pain medication Prescription pain medications Topical creams  A.  Warm water baths or ice packs (30-60 minutes up to 8 times a day, especially after bowel meovements) will help.  Using ice for the first few days can help decrease swelling and bruising,  Use heat such as warm towels, sitz baths, warm baths, warm showers, etc to help relax tight/sore spots and speed recovery.   Some people prefer to use ice alone, heat alone, alternating between ice & heat.  Experiment to what works for you.    It is helpful to take an over-the-counter pain medication continuously for the first few weeks.  This helps people need to use less narcotics Choose one of the following that works best for you: Naproxen (Aleve, etc)  Two 220mg  tabs twice a day Ibuprofen (Advil, etc) Three 200mg  tabs four times a day (every meal & bedtime) Acetaminophen (Tylenol, etc) 500-650mg  four times a day (every meal & bedtime)  A  prescription for pain medication (such as oxycodone, hydrocodone, etc) should be given to you upon discharge.  Take your pain medication as prescribed.  If you are having problems/concerns with the prescription medicine (does not control pain, nausea, vomiting, rash, itching, etc), please call us [phone] to see if we need to switch you to a different pain medicine that will work better for you and/or control your side effect better. Most people will need 1-2 refills to get through the first couple of weeks when pain is the most  intense.  If you need a refill on your pain medication, please contact your pharmacy.  They will contact our office to request authorization. Prescriptions will not be filled after 5 pm or on week-ends.  If can take up to 48 hours for it to be filled & ready so avoid waiting until you  are down to thel ast pill.  A numbing topical cream (Dibucaine) may be given to you.  Many people find relief with topical creams.  Some people find it burns too much.  Experiment.  If it helps, use it.  If it burns, stop using it.  You also may receive a prescription for diazepam, a muscle relaxant to help you to be able to urinate at first easily.  It is safe to take a few doses with the other medications as long as you are not planning to drive or do anything intense.  Hopefully this can minimize the chance of needing a Foley catheter into your bladder     Have a bowel movement every day  Until you have a bowel movement after surgery, he will struggle with urinating and controlling her pain.  Take a fiber supplement (such as Metamucil, Citrucel, FiberCon, MiraLax, etc) 2-4 doses a day to help prevent constipation from occurring.     If you have not had a bowel movement the second postoperative day:  -switch to drinking liquids only -take MiraLAX double dose every 2 hours x 3 or until you have a BM -If that does not work x 3 doses, increase to 4 doses every 2 hours (Like a small bowel prep) until you have a bowel movement. -Avoid enemas or suppositories (can harm sutures/repair) -Once you start having bowel movements, adjust your fiber supplement or Miralax dosing back down until you are having 1-2 soft well formed BMs a day.  (Start at 2 doses a day & adjust up or down to avoid diarrhea or recurrent constipation)   Watch out for diarrhea.  If you have many loose bowel movements, simplify your diet to bland foods & liquids for a few days.  Stop any stool softeners and decrease your fiber supplement.  Switching to mild anti-diarrheal medications (Kayopectate, Pepto Bismol) can help.  Can try an imodium/loperamide dose.  If this worsens or does not improve, please call us.  Wound Care   a. You have some fluffed cotton on top of the anus to help catch drainage and bleeding.  THERE IS NO  PACKING INSIDE THE RECTUM -Let the cotton fall off with the first bowel movement or shower.  It is okay to reinforce or replace as needed.  Bleeding is common at first and gradually slows down after a few weeks   b. Place soft cotton balls on the anus/wounds and use an absorbent pad in your underwear as needed to catch any drainage and help keep the area.  Try to use cotton balls or pads (regular gauze or toilet paper will stick and pull, causing pain.  Cotton will come off more easily).  OK to try dry powders such as cornstarch or baby powders   c. Keep the area clean and dry.  Bathe / shower every day.  Keep the area clean by showering / bathing over the incision / wound.   It is okay to soak an open wound to help wash it.  Consider using a squeeze bottle filled with warm water to gently wash the anal area.  Wet wipes or showers / gentle washing after bowel movements  is often less traumatic than regular toilet paper.           D.  Use a Sitz Bath 4-8 times a day for relief  A sitz bath is a warm water bath taken in the sitting position that covers only the hips and buttocks. It may be used for either healing or hygiene purposes. Sitz baths are also used to relieve pain, itching, or muscle spasms.  Gently cleaned the area and the heat will help lower spasm and offer better pain control.    Fill the bathtub half full with warm water. Sit in the water and open the drain a little. Turn on the warm water to keep the tub half full. Keep the water running constantly. Soak in the water for 15 to 20 minutes. After the sitz bath, pat the affected area dry first.   d. You will often notice bleeding, especially with bowel movements.  This should slow down by the end of the first week of surgery.  It can take over a month to more fully stop.  Sitting on an ice pack can help.   e. Expect some drainage.  You often will have some blood or yellow drainage with open wounds.  Sometimes she will get a little leaking  of liquid stool until the incision/wounds have fully close down.  This should slow down by the end of the first week of surgery, but you will have occasional bleeding or drainage up to a few months after surgery until all incisions & wounds have closed.  Wear an absorbent pad or soft cotton gauze in your underwear until the drainage stops.  ACTIVITIES as tolerated:    You may resume regular (light) daily activities beginning the next day--such as daily self-care, walking, climbing stairs--gradually increasing activities as tolerated.  If you can walk 30 minutes without difficulty, it is safe to try more intense activity such as jogging, treadmill, bicycling, low-impact aerobics, swimming, etc. Save the most intensive and strenuous activity for last such as sit-ups, heavy lifting, contact sports, etc  Refrain from any heavy lifting or straining until you are off narcotics for pain control.   DO NOT PUSH THROUGH PAIN.  Let pain be your guide: If it hurts to do something, don't do it.  Pain is your body warning you to avoid that activity for another week until the pain goes down. You may drive when you are no longer taking prescription pain medication, you can comfortably sit for long periods of time, and you can safely maneuver your car and apply brakes. You may have sexual intercourse when it is comfortable.   6.  FOLLOW UP in our office Please call [facility] at [phone] to set up an appointment to see your surgeon in the office for a follow-up appointment approximately 3 weeks after your surgery. If tissue is sent for pathology, it usually takes a week for pathology results to come back.  We will make an effort to call you with results as soon as we get them.  If you have not heard back in a week and wish to know the results before your postop visit, please call our office and we will see if we can give feedback on the results Make sure that you call for this appointment the day you arrive home to  ensure a convenient appointment time.  7. IF YOU HAVE DISABILITY OR FAMILY LEAVE FORMS, BRING THEM TO THE OFFICE FOR PROCESSING.  DO NOT GIVE THEM TO YOUR  DOCTOR.        WHEN TO CALL us [phone]: Poor pain control Reactions / problems with new medications (rash/itching, nausea, etc)  Fever over 101.5 F (38.5 C) Inability to urinate Nausea and/or vomiting Worsening swelling or bruising Continued bleeding from incision. Increased pain, redness, or drainage from the incision  The clinic staff is available to answer your questions during regular business hours (8:30am-5pm).  Please don't hesitate to call and ask to speak to one of our nurses for clinical concerns.   A surgeon from [facility] is always on call at the hospitals   If you have a medical emergency, go to the nearest emergency room or call 911.    [facility] [address], [place], [place]  [address] ? MAIN: [phone] ? TOLL FREE: [phone] ? FAX [phone] [email or web address]  #####################################################

## 2023-05-07 NOTE — Anesthesia Procedure Notes (Signed)
 Procedure Name: Intubation Date/Time: 05/07/2023 8:43 AM  Performed by: Deri Fuelling, CRNAPre-anesthesia Checklist: Patient identified, Emergency Drugs available, Suction available and Patient being monitored Patient Re-evaluated:Patient Re-evaluated prior to induction Oxygen Delivery Method: Circle system utilized Preoxygenation: Pre-oxygenation with 100% oxygen Induction Type: IV induction Ventilation: Mask ventilation without difficulty Laryngoscope Size: Glidescope and 4 Grade View: Grade I Tube type: Oral Tube size: 7.5 mm Number of attempts: 1 Airway Equipment and Method: Stylet and Oral airway Placement Confirmation: ETT inserted through vocal cords under direct vision, positive ETCO2 and breath sounds checked- equal and bilateral Secured at: 21 cm Tube secured with: Tape Dental Injury: Teeth and Oropharynx as per pre-operative assessment

## 2023-05-07 NOTE — Op Note (Signed)
 05/07/2023  9:16 AM  PATIENT:  Gregory Porter  58 y.o. male  Patient Care Team: Sigmund Hazel, MD as PCP - General (Family Medicine) Karie Soda, MD as Consulting Physician (General Surgery) Griffin Dakin, MD as Referring Physician (Orthopedic Surgery) Willis Modena, MD as Consulting Physician (Gastroenterology)  PRE-OPERATIVE DIAGNOSIS:  HEMORRHOID - EXTERNAL, HEMORRHOID - INTERNAL GRADE 4 PROLAPSED, and HEMORRHOID - INTERNAL GRADE 3 PROLAPSING  POST-OPERATIVE DIAGNOSIS:  HEMORRHOID - EXTERNAL, HEMORRHOID - INTERNAL GRADE 4 PROLAPSED, and HEMORRHOID - INTERNAL GRADE 3 PROLAPSING  PROCEDURE: HEMORRHOIDAL LIGATION & HEMORRHOIDOPEXY and HEMORRHOIDECTOMY x 3  SURGEON:  Ardeth Sportsman, MD  ASSISTANT:  (n/a)   ANESTHESIA:   General endotracheal intubation anesthesia (GETA) and Anorectal and field block for perioperative & postoperative pain control provided with liposomal bupivacaine (Experel) 20mL mixed with 30mL of bupivicaine 0.5% with epinephrine  Estimated Blood Loss (EBL):   Total I/O In: 500 [I.V.:500] Out: 100 [Blood:100].   (See anesthesia record)  Delay start of Pharmacological VTE agent (>24hrs) due to concerns of significant anemia, surgical blood loss, or risk of bleeding?:  no  DRAINS: (None)  SPECIMEN:  Hemorrhoid: Right anterior, Right posterior, and Left lateral  DISPOSITION OF SPECIMEN:  Pathology  COUNTS:  Sponge, needle, & instrument counts CORRECT  PLAN OF CARE: Discharge to home after PACU  PATIENT DISPOSITION:  PACU - hemodynamically stable.  INDICATION: Pleasant patient with struggles with hemorrhoids.  Not able to be managed in the office despite an improved bowel regimen.  I recommended examination under anesthesia and surgical treatment:  The anatomy & physiology of the anorectal region was discussed.  The pathophysiology of hemorrhoids and differential diagnosis was discussed.  Natural history risks without surgery was discussed.   I  stressed the importance of a bowel regimen to have daily soft bowel movements to minimize progression of disease.  Interventions such as sclerotherapy & banding were discussed.  The patient's symptoms are not adequately controlled by medicines and other non-operative treatments.  I feel the risks & problems of no surgery outweigh the operative risks; therefore, I recommended surgery to treat the hemorrhoids by ligation, pexy, and possible resection.  Risks such as bleeding, infection, need for further treatment, heart attack, death, and other risks were discussed.   I noted a good likelihood this will help address the problem.  Goals of post-operative recovery were discussed as well.  Possibility that this will not correct all symptoms was explained.  Post-operative pain, bleeding, constipation, urinary difficulties, and other problems after surgery were discussed.  We will work to minimize complications.   Educational handouts further explaining the pathology, treatment options, and bowel regimen were given as well.  Questions were answered.  The patient expresses understanding & wishes to proceed with surgery.  OR FINDINGS: Very large left lateral and right posterior hemorrhoids grade 4 with chronic external tissue..  Right anterior grade 3.  No fissure fistula or abscess.  No tumor.  No proctitis.  No pilonidal disease.  DESCRIPTION:   Informed consent was confirmed. Patient underwent general anesthesia without difficulty. Patient was placed into  prone/jacknife positioning.  The perianal region was prepped and draped in sterile fashion. Surgical time-out confirmed our plan.  I did digital rectal examination and then transitioned over to anoscopy to get a sense of the anatomy.  Findings noted above.   I proceeded to do hemorrhoidal ligation and pexy using a large self-retaining Parks retractor & occasionally alternating with a large International aid/development worker.  I used a  2-0 Vicryl suture on a UR-6  needle in a figure-of-eight fashion 6 cm proximal to the anal verge.  I started at the largest hemorrhoid pile, left lateral..  Because of redundant hemorrhoidal tissue too bulky to merely ligate or pexy, I excised the excess internal hemorrhoid piles longitudinally in a fusiform biconcave fashion, sparing the anal canal to avoid narrowing.  I then ran that stitch longitudinally more distally to close the hemorrhoidectomy wound to the anal verge over a large Parks self-retaining anal retractor to avoid narrowing of the anal canal.  I then tied that stitch down to cause a hemorrhoidopexy.   I also had to do an excision at the  right anterior and left lateral pile locations.  I then did hemorrhoidal ligation and pexy at the other 3 hemorrhoidal columns.  At the completion of this, all 6 anorectal columns were ligated and pexied in the classic hexagonal fashion (right anterior/lateral/posterior, left anterior/lateral/posterior).    I redid anoscopy and examination.  Hemostasis was good.  I radially trimmed and closed closed the external part of the hemorrhoidectomy wounds right posterior and left lateral with radial interrupted horizontal mattress 2-0 chromic suture over a large Sawyer anal retractor, leaving the last 5 mm open to allow natural drainage.  I repeated anoscopy & examination.  At completion of this, all hemorrhoids had been trimmed down and/or reduced into the rectum.  There is no more prolapse.  Internal & external anatomy was more more normal.  Hemostasis was good.  Fluffed gauze was on-laid over the perianal region.  No packing done.  Patient is being extubated go to go to the recovery room.  I had discussed postop care in detail with the patient in the preop holding area.  Instructions for post-operative recovery and prescriptions are written. I called his sister but got voicemail.  I discussed operative findings, updated the patient's status, discussed probable steps to recovery, and gave  postoperative recommendations to the patient's sister, Gregory Porter .    Ardeth Sportsman, M.D., F.A.C.S. Gastrointestinal and Minimally Invasive Surgery [facility] [address] [place], [place] [address] [phone] Main / Paging

## 2023-05-07 NOTE — Anesthesia Postprocedure Evaluation (Signed)
 Anesthesia Post Note  Patient: Gregory Porter  Procedure(s) Performed: ANORECTAL EXAM UNDER ANESTHESIA WITH HEMORRHOIDECTOMY WITH LIGATION AND HEMORRHOIDOPEXY,     Patient location during evaluation: PACU Anesthesia Type: General Level of consciousness: awake and alert Pain management: pain level controlled Vital Signs Assessment: post-procedure vital signs reviewed and stable Respiratory status: spontaneous breathing, nonlabored ventilation, respiratory function stable and patient connected to nasal cannula oxygen Cardiovascular status: blood pressure returned to baseline and stable Postop Assessment: no apparent nausea or vomiting Anesthetic complications: no   No notable events documented.  Last Vitals:  Vitals:   05/07/23 1100 05/07/23 1320  BP: (!) 144/84 (!) 164/94  Pulse:  70  Resp: 12 16  Temp:    SpO2: 99% 100%    Last Pain:  Vitals:   05/07/23 1100  TempSrc:   PainSc: 2                  Sherol Sabas S

## 2023-05-07 NOTE — Anesthesia Preprocedure Evaluation (Signed)
 Anesthesia Evaluation  Patient identified by MRN, date of birth, ID band Patient awake    Reviewed: Allergy & Precautions, H&P , NPO status , Patient's Chart, lab work & pertinent test results  Airway Mallampati: II   Neck ROM: full    Dental   Pulmonary asthma    breath sounds clear to auscultation       Cardiovascular hypertension,  Rhythm:regular Rate:Normal     Neuro/Psych    GI/Hepatic   Endo/Other    Renal/GU      Musculoskeletal  (+) Arthritis ,    Abdominal   Peds  Hematology   Anesthesia Other Findings   Reproductive/Obstetrics                             Anesthesia Physical Anesthesia Plan  ASA: 2  Anesthesia Plan: General   Post-op Pain Management:    Induction: Intravenous  PONV Risk Score and Plan: 2 and Ondansetron, Dexamethasone, Midazolam and Treatment may vary due to age or medical condition  Airway Management Planned: Oral ETT  Additional Equipment:   Intra-op Plan:   Post-operative Plan: Extubation in OR  Informed Consent: I have reviewed the patients History and Physical, chart, labs and discussed the procedure including the risks, benefits and alternatives for the proposed anesthesia with the patient or authorized representative who has indicated his/her understanding and acceptance.     Dental advisory given  Plan Discussed with: CRNA, Anesthesiologist and Surgeon  Anesthesia Plan Comments:        Anesthesia Quick Evaluation

## 2023-05-08 ENCOUNTER — Encounter (HOSPITAL_COMMUNITY): Payer: Self-pay | Admitting: Surgery

## 2023-05-08 LAB — SURGICAL PATHOLOGY

## 2023-06-26 DIAGNOSIS — D649 Anemia, unspecified: Secondary | ICD-10-CM | POA: Diagnosis not present

## 2023-11-23 DIAGNOSIS — I10 Essential (primary) hypertension: Secondary | ICD-10-CM | POA: Diagnosis not present

## 2023-11-23 DIAGNOSIS — Z0000 Encounter for general adult medical examination without abnormal findings: Secondary | ICD-10-CM | POA: Diagnosis not present

## 2023-11-23 DIAGNOSIS — R7303 Prediabetes: Secondary | ICD-10-CM | POA: Diagnosis not present

## 2023-11-23 DIAGNOSIS — D649 Anemia, unspecified: Secondary | ICD-10-CM | POA: Diagnosis not present

## 2023-11-23 DIAGNOSIS — I2510 Atherosclerotic heart disease of native coronary artery without angina pectoris: Secondary | ICD-10-CM | POA: Diagnosis not present

## 2023-12-14 DIAGNOSIS — H40013 Open angle with borderline findings, low risk, bilateral: Secondary | ICD-10-CM | POA: Diagnosis not present

## 2023-12-14 DIAGNOSIS — H35033 Hypertensive retinopathy, bilateral: Secondary | ICD-10-CM | POA: Diagnosis not present

## 2023-12-14 DIAGNOSIS — H52223 Regular astigmatism, bilateral: Secondary | ICD-10-CM | POA: Diagnosis not present

## 2023-12-14 DIAGNOSIS — H5213 Myopia, bilateral: Secondary | ICD-10-CM | POA: Diagnosis not present

## 2023-12-14 DIAGNOSIS — H2513 Age-related nuclear cataract, bilateral: Secondary | ICD-10-CM | POA: Diagnosis not present

## 2023-12-14 DIAGNOSIS — H524 Presbyopia: Secondary | ICD-10-CM | POA: Diagnosis not present
